# Patient Record
Sex: Female | Born: 1985 | Race: Black or African American | Hispanic: No | Marital: Single | Smoking: Never smoker
Health system: Southern US, Community
[De-identification: ages and names within clinical notes are randomized; demographics above are authoritative.]

## PROBLEM LIST (undated history)

## (undated) DIAGNOSIS — B009 Herpesviral infection, unspecified: Secondary | ICD-10-CM

## (undated) DIAGNOSIS — L409 Psoriasis, unspecified: Secondary | ICD-10-CM

## (undated) DIAGNOSIS — R011 Cardiac murmur, unspecified: Secondary | ICD-10-CM

## (undated) DIAGNOSIS — E349 Endocrine disorder, unspecified: Secondary | ICD-10-CM

## (undated) DIAGNOSIS — A64 Unspecified sexually transmitted disease: Secondary | ICD-10-CM

## (undated) DIAGNOSIS — J302 Other seasonal allergic rhinitis: Secondary | ICD-10-CM

## (undated) DIAGNOSIS — N946 Dysmenorrhea, unspecified: Secondary | ICD-10-CM

## (undated) DIAGNOSIS — L309 Dermatitis, unspecified: Secondary | ICD-10-CM

## (undated) DIAGNOSIS — D219 Benign neoplasm of connective and other soft tissue, unspecified: Secondary | ICD-10-CM

## (undated) DIAGNOSIS — E611 Iron deficiency: Secondary | ICD-10-CM

## (undated) DIAGNOSIS — R7309 Other abnormal glucose: Secondary | ICD-10-CM

## (undated) HISTORY — DX: Cardiac murmur, unspecified: R01.1

## (undated) HISTORY — DX: Endocrine disorder, unspecified: E34.9

## (undated) HISTORY — DX: Other abnormal glucose: R73.09

## (undated) HISTORY — DX: Unspecified sexually transmitted disease: A64

## (undated) HISTORY — DX: Iron deficiency: E61.1

## (undated) HISTORY — DX: Dysmenorrhea, unspecified: N94.6

## (undated) HISTORY — PX: EYE SURGERY: SHX253

## (undated) HISTORY — DX: Herpesviral infection, unspecified: B00.9

## (undated) HISTORY — PX: OTHER SURGICAL HISTORY: SHX169

## (undated) HISTORY — DX: Psoriasis, unspecified: L40.9

## (undated) HISTORY — DX: Other seasonal allergic rhinitis: J30.2

## (undated) HISTORY — DX: Benign neoplasm of connective and other soft tissue, unspecified: D21.9

## (undated) HISTORY — DX: Dermatitis, unspecified: L30.9

---

## 2009-06-25 ENCOUNTER — Emergency Department (HOSPITAL_COMMUNITY): Admission: EM | Admit: 2009-06-25 | Discharge: 2009-06-26 | Payer: Self-pay | Admitting: Emergency Medicine

## 2010-07-06 ENCOUNTER — Emergency Department (HOSPITAL_COMMUNITY): Admission: EM | Admit: 2010-07-06 | Discharge: 2010-07-06 | Payer: Self-pay | Admitting: Emergency Medicine

## 2011-01-17 LAB — PREGNANCY, URINE: Preg Test, Ur: NEGATIVE

## 2011-02-09 LAB — GC/CHLAMYDIA PROBE AMP, GENITAL
Chlamydia, DNA Probe: NEGATIVE
GC Probe Amp, Genital: NEGATIVE

## 2011-02-09 LAB — URINALYSIS, ROUTINE W REFLEX MICROSCOPIC
Bilirubin Urine: NEGATIVE
Nitrite: NEGATIVE
Specific Gravity, Urine: 1.023 (ref 1.005–1.030)
pH: 6.5 (ref 5.0–8.0)

## 2011-02-09 LAB — RPR: RPR Ser Ql: NONREACTIVE

## 2012-10-09 ENCOUNTER — Other Ambulatory Visit: Payer: Self-pay | Admitting: Obstetrics & Gynecology

## 2012-10-09 DIAGNOSIS — D219 Benign neoplasm of connective and other soft tissue, unspecified: Secondary | ICD-10-CM

## 2012-10-09 DIAGNOSIS — R1900 Intra-abdominal and pelvic swelling, mass and lump, unspecified site: Secondary | ICD-10-CM

## 2012-10-14 ENCOUNTER — Ambulatory Visit (HOSPITAL_COMMUNITY)
Admission: RE | Admit: 2012-10-14 | Discharge: 2012-10-14 | Disposition: A | Discharge status: Home / Self Care | Payer: BC Managed Care – PPO | Source: Ambulatory Visit | Attending: Obstetrics & Gynecology | Admitting: Obstetrics & Gynecology

## 2012-10-14 DIAGNOSIS — R1903 Right lower quadrant abdominal swelling, mass and lump: Secondary | ICD-10-CM | POA: Insufficient documentation

## 2012-10-14 DIAGNOSIS — R1900 Intra-abdominal and pelvic swelling, mass and lump, unspecified site: Secondary | ICD-10-CM

## 2012-10-14 DIAGNOSIS — D252 Subserosal leiomyoma of uterus: Secondary | ICD-10-CM | POA: Insufficient documentation

## 2012-10-14 DIAGNOSIS — D219 Benign neoplasm of connective and other soft tissue, unspecified: Secondary | ICD-10-CM

## 2013-07-23 ENCOUNTER — Encounter: Payer: Self-pay | Admitting: Obstetrics and Gynecology

## 2013-07-23 ENCOUNTER — Ambulatory Visit (INDEPENDENT_AMBULATORY_CARE_PROVIDER_SITE_OTHER): Payer: BC Managed Care – PPO | Admitting: Obstetrics and Gynecology

## 2013-07-23 VITALS — BP 120/80 | HR 76 | Ht 62.5 in | Wt 182.0 lb

## 2013-07-23 DIAGNOSIS — A499 Bacterial infection, unspecified: Secondary | ICD-10-CM

## 2013-07-23 DIAGNOSIS — Z113 Encounter for screening for infections with a predominantly sexual mode of transmission: Secondary | ICD-10-CM

## 2013-07-23 DIAGNOSIS — D219 Benign neoplasm of connective and other soft tissue, unspecified: Secondary | ICD-10-CM

## 2013-07-23 DIAGNOSIS — B9689 Other specified bacterial agents as the cause of diseases classified elsewhere: Secondary | ICD-10-CM

## 2013-07-23 DIAGNOSIS — N760 Acute vaginitis: Secondary | ICD-10-CM

## 2013-07-23 DIAGNOSIS — D259 Leiomyoma of uterus, unspecified: Secondary | ICD-10-CM

## 2013-07-23 MED ORDER — METRONIDAZOLE 0.75 % VA GEL: 1.0000 | Freq: Every day | VAGINAL | Status: DC

## 2013-07-23 NOTE — Patient Instructions (Signed)
Bacterial Vaginosis Bacterial vaginosis (BV) is a vaginal infection where the normal balance of bacteria in the vagina is disrupted. The normal balance is then replaced by an overgrowth of certain bacteria. There are several different kinds of bacteria that can cause BV. BV is the most common vaginal infection in women of childbearing age. CAUSES   The cause of BV is not fully understood. BV develops when there is an increase or imbalance of harmful bacteria.  Some activities or behaviors can upset the normal balance of bacteria in the vagina and put women at increased risk including:  Having a new sex partner or multiple sex partners.  Douching.  Using an intrauterine device (IUD) for contraception.  It is not clear what role sexual activity plays in the development of BV. However, women that have never had sexual intercourse are rarely infected with BV. Women do not get BV from toilet seats, bedding, swimming pools or from touching objects around them.  SYMPTOMS   Grey vaginal discharge.  A fish-like odor with discharge, especially after sexual intercourse.  Itching or burning of the vagina and vulva.  Burning or pain with urination.  Some women have no signs or symptoms at all. DIAGNOSIS  Your caregiver must examine the vagina for signs of BV. Your caregiver will perform lab tests and look at the sample of vaginal fluid through a microscope. They will look for bacteria and abnormal cells (clue cells), a pH test higher than 4.5, and a positive amine test all associated with BV.  RISKS AND COMPLICATIONS   Pelvic inflammatory disease (PID).  Infections following gynecology surgery.  Developing HIV.  Developing herpes virus. TREATMENT  Sometimes BV will clear up without treatment. However, all women with symptoms of BV should be treated to avoid complications, especially if gynecology surgery is planned. Female partners generally do not need to be treated. However, BV may spread  between female sex partners so treatment is helpful in preventing a recurrence of BV.   BV may be treated with antibiotics. The antibiotics come in either pill or vaginal cream forms. Either can be used with nonpregnant or pregnant women, but the recommended dosages differ. These antibiotics are not harmful to the baby.  BV can recur after treatment. If this happens, a second round of antibiotics will often be prescribed.  Treatment is important for pregnant women. If not treated, BV can cause a premature delivery, especially for a pregnant woman who had a premature birth in the past. All pregnant women who have symptoms of BV should be checked and treated.  For chronic reoccurrence of BV, treatment with a type of prescribed gel vaginally twice a week is helpful. HOME CARE INSTRUCTIONS   Finish all medication as directed by your caregiver.  Do not have sex until treatment is completed.  Tell your sexual partner that you have a vaginal infection. They should see their caregiver and be treated if they have problems, such as a mild rash or itching.  Practice safe sex. Use condoms. Only have 1 sex partner. PREVENTION  Basic prevention steps can help reduce the risk of upsetting the natural balance of bacteria in the vagina and developing BV:  Do not have sexual intercourse (be abstinent).  Do not douche.  Use all of the medicine prescribed for treatment of BV, even if the signs and symptoms go away.  Tell your sex partner if you have BV. That way, they can be treated, if needed, to prevent reoccurrence. SEEK MEDICAL CARE IF:     Your symptoms are not improving after 3 days of treatment.  You have increased discharge, pain, or fever. MAKE SURE YOU:   Understand these instructions.  Will watch your condition.  Will get help right away if you are not doing well or get worse. FOR MORE INFORMATION  Division of STD Prevention (DSTDP), Centers for Disease Control and Prevention:  SolutionApps.co.za American Social Health Association (ASHA): www.ashastd.org  Document Released: 10/21/2005 Document Revised: 01/13/2012 Document Reviewed: 04/13/2009 [facility] Patient Information 2014 [place], [place].  Fibroids Fibroids are lumps (tumors) that can occur any place in a woman's body. These lumps are not cancerous. Fibroids vary in size, weight, and where they grow. HOME CARE  Do not take aspirin.  Write down the number of pads or tampons you use during your period. Tell your doctor. This can help determine the best treatment for you. GET HELP RIGHT AWAY IF:  You have pain in your lower belly (abdomen) that is not helped with medicine.  You have cramps that are not helped with medicine.  You have more bleeding between or during your period.  You feel lightheaded or pass out (faint).  Your lower belly pain gets worse. MAKE SURE YOU:  Understand these instructions.  Will watch your condition.  Will get help right away if you are not doing well or get worse. Document Released: 11/23/2010 Document Revised: 01/13/2012 Document Reviewed: 11/23/2010 [facility] Patient Information 2014 [place], [place].

## 2013-07-23 NOTE — Progress Notes (Signed)
Patient ID: Allison Bentley, female   DOB: January 03, 1986, 27 y.o.   MRN: [id number] GYNECOLOGY PROBLEM VISIT  PCP:   [facility]  Referring provider:   HPI: 27 y.o.   Single  African American  female   G0P0 with Patient's last menstrual period was 07/11/2013.   here for  Second Opinion on Fibroids and also having vaginal discharge.  Discharge is brownish yellow off and on for a month. Some odor. No burning or itching.  No treatment.  Last intercourse was 2 months ago.  No protection. Accepts STD testing today.  Seen at [facility].   Does not want surgery. Noted on exam one year ago. Does not know she has fibroids but menses are a little more crampy. Menses heavy at the beginning.  Pad change every 2 - 3 hours. Can stain sheets at night. No intermenstrual bleeding. Some urinary urgency and frequency.   Empties bladder every 1 1/2 hours.   Sometimes drinks tea.   Pelvic ultrasound 10/09/12 -  RADIOLOGY REPORT*  Clinical Data: Right lower abdominal mass. Fibroids.  TRANSABDOMINAL AND TRANSVAGINAL ULTRASOUND OF PELVIS  Technique: Both transabdominal and transvaginal ultrasound  examinations of the pelvis were performed. Transabdominal  technique was performed for global imaging of the pelvis including  uterus, ovaries, adnexal regions, and pelvic cul-de-sac.  It was necessary to proceed with endovaginal exam following the  transabdominal exam to visualize the endometrium and ovaries.  Comparison: None.  Findings:  Uterus: 8.5 x 4.0 x 5.7 cm. A large subserosal fibroid is seen  arising from the right uterine fundus which measures 5.8 x 4.8 x  4.8 cm. A second smaller subserosal fibroid is seen arising from  the left posterior corpus which measures 3.0 x 2.1 x 2.8 cm.  Endometrium: Double layer thickness measures 5 mm transvaginally.  No focal lesion visualized.  Right ovary: 3.9 x 2.1 x 2.4 cm. Normal appearance.  Left ovary: 4.2 x 2.4 x 3.8 cm. Normal appearance.   Other Findings: No free fluid  IMPRESSION:  1. Uterine fibroids, largest of which is subserosal in the right  uterine fundus measuring 5.8 cm.  2. Normal appearanceof both ovaries.  Original Report Authenticated By: Myles Rosenthal, M.D.   GYNECOLOGIC HISTORY: Patient's last menstrual period was 07/11/2013. Sexually active:  Yes.  Not currently active.   Partner preference: female Contraception:  none  Menopausal hormone therapy: no DES exposure: no   Blood transfusions:  no  Sexually transmitted diseases: Hx of Gonorrhea and Chlamydia age 27   GYN Procedures:  no Mammogram:  never                 Pap:  10/2012 wnl - [facility].   History of abnormal pap smear:  Had abnormal pap in 2009 with no colposcopy or treatment to cervix.  Pap smear reverted to normal.   HGB:  11.9 OB History   Grav Para Term Preterm Abortions TAB SAB Ect Mult Living   0                  Family History  Problem Relation Age of Onset  . Hypertension Mother   . Thyroid disease Mother   . Diabetes Father   . Hypertension Father   . Diabetes Maternal Grandmother   . Hypertension Maternal Grandmother   . Stroke Maternal Grandmother   . Diabetes Maternal Grandfather   . Hypertension Maternal Grandfather   . Hypertension Paternal Grandmother   . Hypertension Paternal Grandfather  There are no active problems to display for this patient.   Past Medical History  Diagnosis Date  . Psoriasis of scalp   . Eczema     arms and neck  . Dysmenorrhea   . Fibroid   . Heart murmur     dx'd as a teenager and told would outgrow  . Hormone disorder     enlarged thyroid  . STD (sexually transmitted disease)     Tx'd for Chlamydia and Gonorrhea age 27  . Seasonal allergies     Past Surgical History  Procedure Laterality Date  . Eye surgery      to remove styes at age 27  . Wisdom teeth Bilateral     ALLERGIES: Review of patient's allergies indicates no known allergies.  Current Outpatient Prescriptions   Medication Sig Dispense Refill  . FLUOCINOLONE ACETONIDE SCALP 0.01 % external oil Apply 1 application topically every 14 (fourteen) days.      Marland Kitchen loratadine (CLARITIN) 10 MG tablet Take 10 mg by mouth as needed for allergies.       No current facility-administered medications for this visit.     ROS:  Pertinent items are noted in HPI.  SOCIAL HISTORY:    Runner, broadcasting/film/video.  Graduate school.    PHYSICAL EXAMINATION:    BP 120/80  Pulse 76  Ht 5' 2.5" (1.588 m)  Wt 182 lb (82.555 kg)  BMI 32.74 kg/m2  LMP 07/11/2013   Wt Readings from Last 3 Encounters:  07/23/13 182 lb (82.555 kg)     Ht Readings from Last 3 Encounters:  07/23/13 5' 2.5" (1.588 m)    General appearance: alert, cooperative and appears stated age Head: Normocephalic, without obvious abnormality, atraumatic Lungs: clear to auscultation bilaterally Heart: regular rate and rhythm Abdomen: soft, non-tender; no masses,  no organomegaly Extremities: extremities normal, atraumatic, no cyanosis or edema Skin: Skin color, texture, turgor normal. No rashes or lesions Lymph nodes: Cervical, supraclavicular, and axillary nodes normal. No abnormal inguinal nodes palpated Neurologic: Grossly normal  Pelvic: External genitalia:  no lesions              Urethra:  normal appearing urethra with no masses, tenderness or lesions              Bartholins and Skenes: normal                 Vagina: normal appearing vagina with normal color and discharge, no lesions              Cervix: normal appearance                       Bimanual Exam:  Uterus:  uterus with anterior LUS fibroid and 12 week size uterus.                                        Adnexa: normal adnexa in size, nontender and no masses                                      Rectovaginal exam - confirms above  Wet prep - pH 5.5, positive clue cells, negative yeast and trichomonas.   ASSESSMENT  Bacterial vaginosis. Uterine fibroids.   PLAN  Return for pelvic ultrasound  to understand if fibroids are stable  or not. Metrogel.  See EPIC orders.  Annual exam due in December.     An After Visit Summary was printed and given to the patient.

## 2013-07-24 LAB — STD PANEL
HIV: NONREACTIVE
Hepatitis B Surface Ag: NEGATIVE

## 2013-07-24 LAB — HEPATITIS C ANTIBODY: HCV Ab: NEGATIVE

## 2013-07-24 LAB — GC/CHLAMYDIA PROBE AMP, URINE: GC Probe Amp, Urine: NEGATIVE

## 2013-07-26 ENCOUNTER — Telehealth: Payer: Self-pay

## 2013-07-26 NOTE — Telephone Encounter (Signed)
Patient notified STD testing negative

## 2013-07-26 NOTE — Telephone Encounter (Signed)
LMOVM to call for lab results. 

## 2013-07-26 NOTE — Telephone Encounter (Signed)
Message copied by Alphonsa Overall on Mon Jul 26, 2013  2:45 PM ------      Message from: Conley Simmonds      Created: Sun Jul 25, 2013  8:52 AM       Please inform patient of negative STD testing. ------

## 2013-07-26 NOTE — Telephone Encounter (Signed)
Patient is returning call to [facility].

## 2013-07-27 ENCOUNTER — Telehealth: Payer: Self-pay | Admitting: Obstetrics and Gynecology

## 2013-07-27 NOTE — Telephone Encounter (Signed)
LMTCB to discuss ins benefits and schedule PUS. Advised that we do have openings on Thursday this week.

## 2013-08-05 ENCOUNTER — Ambulatory Visit (INDEPENDENT_AMBULATORY_CARE_PROVIDER_SITE_OTHER): Payer: BC Managed Care – PPO | Admitting: Obstetrics and Gynecology

## 2013-08-05 ENCOUNTER — Ambulatory Visit (INDEPENDENT_AMBULATORY_CARE_PROVIDER_SITE_OTHER): Payer: BC Managed Care – PPO

## 2013-08-05 ENCOUNTER — Other Ambulatory Visit: Payer: BC Managed Care – PPO | Admitting: Obstetrics and Gynecology

## 2013-08-05 ENCOUNTER — Other Ambulatory Visit: Payer: BC Managed Care – PPO

## 2013-08-05 ENCOUNTER — Encounter: Payer: Self-pay | Admitting: Obstetrics and Gynecology

## 2013-08-05 VITALS — BP 108/76 | HR 80 | Ht 62.5 in | Wt 185.5 lb

## 2013-08-05 DIAGNOSIS — D219 Benign neoplasm of connective and other soft tissue, unspecified: Secondary | ICD-10-CM

## 2013-08-05 DIAGNOSIS — D259 Leiomyoma of uterus, unspecified: Secondary | ICD-10-CM

## 2013-08-05 NOTE — Patient Instructions (Addendum)
Myomectomy Myoma is a non-cancerous tumor made up of fibrous tissue. It is also called leiomyoma, but more often called a fibroid tumor. Myomectomy is the removal of a fibroid tumor without removing another organ, like the uterus or ovary, with it. Fibroids range from the size of a pea to a grapefruit. They are rarely cancerous. Myomas only need treatment when they are growing or when they cause symptoms, such aspain, pressure, bleeding, and pain with intercourse. LET YOUR CAREGIVER KNOW ABOUT:  Any allergies, especially to medicines.  If you develop a cold or an infection before your surgery.  Medicines taken, including vitamins, herbs, eyedrops, over-ther-counter medicines, and creams.  Use of steroids (by mouth or creams).  Previous problems with numbing medicines.  History of blood clots or other bleeding problems.  Other health problems, such as diabetes, kidney, heart, or lung problems.  Previous surgery.  Possibility of pregnancy, if this applies. RISKS AND COMPLICATIONS   Excessive bleeding.  Infection.  Injury to other organs.  Blood clots in the legs, chest, and brain.  Scar tissue (adhesions) on other organs and in the pelvis.  Death during or after the surgery. BEFORE THE PROCEDURE  Follow your caregiver's advice regarding your surgery and preparing for surgery.  Avoid taking aspirin or blood thinners as directed by your caregiver.  DO NOT eat or drink anything after midnight on the night before surgery, or as directed by your caregiver.  DO NOT smoke (if you smoke) for 2 weeks before the surgery.  DO NOT drink alcohol the day before the surgery.  If you are admitted the day of the surgery,arrive1 hour before your surgery is scheduled.  Arrange to have someone take you home from the hospital.  Arrange to have someone care for you when you go home. PROCEDURE There are several ways to perform a myomectomy:  Hysteroscopy myomectomy. A lighted tube is  inserted inside the uterus. The tube will remove the fibroid. This is used when the fibroid is inside the cavity of the uterus.  Laparoscopic myomectomy. A long, lighted tube is inserted through 2 or 3 small incisions to see the organs in the pelvis. The fibroid is removed.  Myomectomy through a sugical cut (incicion) in the abdomen. The fibroid is removed through an incision made in the stomach. This way is performed when thethe fibroid cannot be removed with a hysteroscope or laprascope. AFTER THE PROCEDURE  If you had laparoscopic or hysteroscopic myomectomy, you may go home the same day or stay overnight.  If you had abdominal myomectomy, you may stay in the hospital a few days.  Your intravenous (IV)access tube and catheter will be removed in 1 or 2 days.  If you stay in the hospital, your caregiver will order pain medicine and a sleeping pill, if needed.  You may be placed on an antibiotic medicine, if needed.  You may be given written instructions and medicines before you are sent home. Document Released: 08/18/2007 Document Revised: 01/13/2012 Document Reviewed: 08/30/2009 [facility] Patient Information 2014 [place], [place].  Fibroids Fibroids are lumps (tumors) that can occur any place in a woman's body. These lumps are not cancerous. Fibroids vary in size, weight, and where they grow. HOME CARE  Do not take aspirin.  Write down the number of pads or tampons you use during your period. Tell your doctor. This can help determine the best treatment for you. GET HELP RIGHT AWAY IF:  You have pain in your lower belly (abdomen) that is not helped with  medicine.  You have cramps that are not helped with medicine.  You have more bleeding between or during your period.  You feel lightheaded or pass out (faint).  Your lower belly pain gets worse. MAKE SURE YOU:  Understand these instructions.  Will watch your condition.  Will get help right away if you are not doing well or  get worse. Document Released: 11/23/2010 Document Revised: 01/13/2012 Document Reviewed: 11/23/2010 [facility] Patient Information 2014 [place], [place].

## 2013-08-05 NOTE — Progress Notes (Signed)
Subjective  Patient is here for ultrasound for uterine fibroids.   Desires future childbearing.   School [employer], broadcasting/film/video.   Last ultrasound  Pelvic ultrasound 10/09/12 -  RADIOLOGY REPORT*  Clinical Data: Right lower abdominal mass. Fibroids.  TRANSABDOMINAL AND TRANSVAGINAL ULTRASOUND OF PELVIS  Technique: Both transabdominal and transvaginal ultrasound  examinations of the pelvis were performed. Transabdominal  technique was performed for global imaging of the pelvis including  uterus, ovaries, adnexal regions, and pelvic cul-de-sac.  It was necessary to proceed with endovaginal exam following the  transabdominal exam to visualize the endometrium and ovaries.  Comparison: None.  Findings:  Uterus: 8.5 x 4.0 x 5.7 cm. A large subserosal fibroid is seen  arising from the right uterine fundus which measures 5.8 x 4.8 x  4.8 cm. A second smaller subserosal fibroid is seen arising from  the left posterior corpus which measures 3.0 x 2.1 x 2.8 cm.  Endometrium: Double layer thickness measures 5 mm transvaginally.  No focal lesion visualized.  Right ovary: 3.9 x 2.1 x 2.4 cm. Normal appearance.  Left ovary: 4.2 x 2.4 x 3.8 cm. Normal appearance.  Other Findings: No free fluid  IMPRESSION:  1. Uterine fibroids, largest of which is subserosal in the right  uterine fundus measuring 5.8 cm.  2. Normal appearanceof both ovaries.  Original Report Authenticated By: Myles Rosenthal, M.D.   Objective  See ultrasound from today below - 3 fibroids noted.  Largest is 7.5 cm right and lateral.  Normal ovaries.     Assessment  Symptomatic uterine fibroids.  Plan  Discussion with patient regarding fibroids etiology, life history, and treatment options. We discussed medical options to control bleeding and surgical options to control bleeding and volume. Declines birth control and Depo Provera for treatment. We discussed abdominal myomectomy at length.  We discussed risks and benefits. We discussed  risks which include but are not limited to bleeding, infection, transfusion, hysterectomy, post op fever, development of scar tissue, need for future cesarean section, uterine rupture, recurrence of fibroids, incomplete procedure if fibroids look like removal would lead to hysterectomy, need for reoperation, reaction to anesthesia, DVT, PE, and death.  She will consider options and return prn.

## 2013-08-06 ENCOUNTER — Encounter: Payer: Self-pay | Admitting: Obstetrics and Gynecology

## 2013-08-06 DIAGNOSIS — D219 Benign neoplasm of connective and other soft tissue, unspecified: Secondary | ICD-10-CM | POA: Insufficient documentation

## 2013-09-23 ENCOUNTER — Telehealth: Payer: Self-pay | Admitting: Obstetrics and Gynecology

## 2013-09-23 NOTE — Telephone Encounter (Signed)
Pt is having some vaginal discharge again. Please call to schedule an appointment.

## 2013-09-23 NOTE — Telephone Encounter (Signed)
Spoke with pt who is having itching. Has tried Monistat with no relief. Pt seen for BV in September, but says the symptoms are not the same. Scheduled OV tomorrow with PG at 10:15.

## 2013-09-24 ENCOUNTER — Encounter: Payer: Self-pay | Admitting: Nurse Practitioner

## 2013-09-24 ENCOUNTER — Ambulatory Visit (INDEPENDENT_AMBULATORY_CARE_PROVIDER_SITE_OTHER): Payer: BC Managed Care – PPO | Admitting: Nurse Practitioner

## 2013-09-24 VITALS — BP 108/62 | HR 92 | Ht 62.5 in | Wt 192.0 lb

## 2013-09-24 DIAGNOSIS — N760 Acute vaginitis: Secondary | ICD-10-CM

## 2013-09-24 DIAGNOSIS — A499 Bacterial infection, unspecified: Secondary | ICD-10-CM

## 2013-09-24 DIAGNOSIS — B9689 Other specified bacterial agents as the cause of diseases classified elsewhere: Secondary | ICD-10-CM

## 2013-09-24 MED ORDER — METRONIDAZOLE 0.75 % VA GEL: 1.0000 | Freq: Every day | VAGINAL | Status: DC

## 2013-09-24 NOTE — Progress Notes (Signed)
Subjective:     Patient ID: Allison Bentley, female   DOB: [birth year], 27 y.o.   MRN: [id number]  HPI This 27 yo SAA Fe here with symptoms of vaginal discharge about a week ago.  She had symptoms of BV in September.  She thinks these symptoms are back and seem to be related to menses. Denies urinary symptoms. Her partner lives away from here and they are rarely SA.  She believes him to be faithful.  She also has been evaluated for uterine fibroids.  She believes she wants to pursue a myomectomy in the summer when school is out.  Review of Systems  Constitutional: Negative for fever, chills and fatigue.  HENT: Negative.   Respiratory: Negative.   Cardiovascular: Negative.   Gastrointestinal: Negative.   Genitourinary: Positive for vaginal discharge. Negative for dysuria, urgency, frequency, flank pain, enuresis and pelvic pain.  Musculoskeletal: Negative.   Skin: Negative.   Neurological: Negative.   Psychiatric/Behavioral: Negative.        Objective:   Physical Exam  Constitutional: She appears well-developed and well-nourished. No distress.  Abdominal: Soft. She exhibits no distension. There is no tenderness. There is no rebound and no guarding.  Genitourinary:  White to yelow tinged vaginal discharge. Wet prep: PH: 5.0; NSS + clue cells; KOH: negative yeast.       Assessment:     BV recurrent after menses History of uterine fibroids    Plan:     Discussed normal vaginal health care and triggers for recurrent infections She will also try Pro B daily Metrogel vaginal cream hs X 5 Her AEX is due in December and wants to discuss further about treatment of uterine fibroids.

## 2013-09-24 NOTE — Patient Instructions (Addendum)
Get Pro B at pharmacy and take daily.   Bacterial Vaginosis Bacterial vaginosis (BV) is a vaginal infection where the normal balance of bacteria in the vagina is disrupted. The normal balance is then replaced by an overgrowth of certain bacteria. There are several different kinds of bacteria that can cause BV. BV is the most common vaginal infection in women of childbearing age. CAUSES   The cause of BV is not fully understood. BV develops when there is an increase or imbalance of harmful bacteria.  Some activities or behaviors can upset the normal balance of bacteria in the vagina and put women at increased risk including:  Having a new sex partner or multiple sex partners.  Douching.  Using an intrauterine device (IUD) for contraception.  It is not clear what role sexual activity plays in the development of BV. However, women that have never had sexual intercourse are rarely infected with BV. Women do not get BV from toilet seats, bedding, swimming pools or from touching objects around them.  SYMPTOMS   Grey vaginal discharge.  A fish-like odor with discharge, especially after sexual intercourse.  Itching or burning of the vagina and vulva.  Burning or pain with urination.  Some women have no signs or symptoms at all. DIAGNOSIS  Your caregiver must examine the vagina for signs of BV. Your caregiver will perform lab tests and look at the sample of vaginal fluid through a microscope. They will look for bacteria and abnormal cells (clue cells), a pH test higher than 4.5, and a positive amine test all associated with BV.  RISKS AND COMPLICATIONS   Pelvic inflammatory disease (PID).  Infections following gynecology surgery.  Developing HIV.  Developing herpes virus. TREATMENT  Sometimes BV will clear up without treatment. However, all women with symptoms of BV should be treated to avoid complications, especially if gynecology surgery is planned. Female partners generally do not  need to be treated. However, BV may spread between female sex partners so treatment is helpful in preventing a recurrence of BV.   BV may be treated with antibiotics. The antibiotics come in either pill or vaginal cream forms. Either can be used with nonpregnant or pregnant women, but the recommended dosages differ. These antibiotics are not harmful to the baby.  BV can recur after treatment. If this happens, a second round of antibiotics will often be prescribed.  Treatment is important for pregnant women. If not treated, BV can cause a premature delivery, especially for a pregnant woman who had a premature birth in the past. All pregnant women who have symptoms of BV should be checked and treated.  For chronic reoccurrence of BV, treatment with a type of prescribed gel vaginally twice a week is helpful. HOME CARE INSTRUCTIONS   Finish all medication as directed by your caregiver.  Do not have sex until treatment is completed.  Tell your sexual partner that you have a vaginal infection. They should see their caregiver and be treated if they have problems, such as a mild rash or itching.  Practice safe sex. Use condoms. Only have 1 sex partner. PREVENTION  Basic prevention steps can help reduce the risk of upsetting the natural balance of bacteria in the vagina and developing BV:  Do not have sexual intercourse (be abstinent).  Do not douche.  Use all of the medicine prescribed for treatment of BV, even if the signs and symptoms go away.  Tell your sex partner if you have BV. That way, they can be treated,  if needed, to prevent reoccurrence. SEEK MEDICAL CARE IF:   Your symptoms are not improving after 3 days of treatment.  You have increased discharge, pain, or fever. MAKE SURE YOU:   Understand these instructions.  Will watch your condition.  Will get help right away if you are not doing well or get worse. FOR MORE INFORMATION  Division of STD Prevention (DSTDP), Centers  for Disease Control and Prevention: [employer] American Social Health Association (ASHA): www.ashastd.org  Document Released: 10/21/2005 Document Revised: 01/13/2012 Document Reviewed: 06/02/2013 [facility] Patient Information 2014 [place], [place].

## 2013-09-27 NOTE — Progress Notes (Signed)
Encounter reviewed by Dr. Brook Silva.  

## 2014-03-04 DIAGNOSIS — L219 Seborrheic dermatitis, unspecified: Secondary | ICD-10-CM | POA: Insufficient documentation

## 2014-03-09 ENCOUNTER — Encounter (HOSPITAL_COMMUNITY): Payer: Self-pay | Admitting: Emergency Medicine

## 2014-03-09 ENCOUNTER — Emergency Department (HOSPITAL_COMMUNITY)
Admission: EM | Admit: 2014-03-09 | Discharge: 2014-03-09 | Disposition: A | Discharge status: Home / Self Care | Payer: BC Managed Care – PPO | Attending: Emergency Medicine | Admitting: Emergency Medicine

## 2014-03-09 DIAGNOSIS — Z8679 Personal history of other diseases of the circulatory system: Secondary | ICD-10-CM | POA: Insufficient documentation

## 2014-03-09 DIAGNOSIS — R011 Cardiac murmur, unspecified: Secondary | ICD-10-CM | POA: Insufficient documentation

## 2014-03-09 DIAGNOSIS — Z872 Personal history of diseases of the skin and subcutaneous tissue: Secondary | ICD-10-CM | POA: Insufficient documentation

## 2014-03-09 DIAGNOSIS — M25569 Pain in unspecified knee: Secondary | ICD-10-CM

## 2014-03-09 DIAGNOSIS — Z8619 Personal history of other infectious and parasitic diseases: Secondary | ICD-10-CM | POA: Insufficient documentation

## 2014-03-09 DIAGNOSIS — Z8742 Personal history of other diseases of the female genital tract: Secondary | ICD-10-CM | POA: Insufficient documentation

## 2014-03-09 MED ORDER — IBUPROFEN 800 MG PO TABS: 800.0000 mg | ORAL_TABLET | Freq: Three times a day (TID) | ORAL | Status: DC

## 2014-03-09 NOTE — Discharge Instructions (Signed)
Meniscus Tear with Phase I Rehab The meniscus is a C-shaped cartilage structure, located in the knee joint between the thigh bone (femur) and the shinbone (tibia). Two menisci are located in each knee joint: the inner and outer meniscus. The meniscus acts as an adapter between the thigh bone and shinbone, allowing them to fit properly together. It also functions as a shock absorber, to reduce the stress placed on the knee joint and to help supply nutrients to the knee joint cartilage. As people age, the meniscus begins to harden and become more vulnerable to injury. Meniscus tears are a common injury, especially in older athletes. Inner meniscus tears are more common than outer meniscus tears.  SYMPTOMS   Pain in the knee, especially with standing or squatting with the affected leg.  Tenderness along the joint line.  Swelling in the knee joint (effusion), usually starting 1 to 2 days after injury.  Locking or catching of the knee joint, causing inability to straighten the knee completely.  Giving way or buckling of the knee. CAUSES  A meniscus tear occurs when a force is placed on the meniscus that is greater than it can handle. Common causes of injury include:  Direct hit (trauma) to the knee.  Twisting, pivoting, or cutting (rapidly changing direction while running), kneeling or squatting.  Without injury, due to aging. RISK INCREASES WITH:  Contact sports (football, rugby).  Sports in which cleats are used with pivoting (soccer, lacrosse) or sports in which good shoe grip and sudden change in direction are required (racquetball, basketball, squash).  Previous knee injury.  Associated knee injury, particularly ligament injuries.  Poor strength and flexibility. PREVENTION  Warm up and stretch properly before activity.  Maintain physical fitness:  Strength, flexibility, and endurance.  Cardiovascular fitness.  Protect the knee with a brace or elastic bandage.  Wear  properly fitted protective equipment (proper cleats for the surface). PROGNOSIS  Sometimes, meniscus tears heal on their own. However, definitive treatment requires surgery, followed by at least 6 weeks of recovery.  RELATED COMPLICATIONS   Recurring symptoms that result in a chronic problem.  Repeated knee injury, especially if sports are resumed too soon after injury or surgery.  Progression of the tear (the tear gets larger), if untreated.  Arthritis of the knee in later years (with or without surgery).  Complications of surgery, including infection, bleeding, injury to nerves (numbness, weakness, paralysis) continued pain, giving way, locking, nonhealing of meniscus (if repaired), need for further surgery, and knee stiffness (loss of motion). TREATMENT  Treatment first involves the use of ice and medicine, to reduce pain and inflammation. You may find using crutches to walk more comfortable. However, it is okay to bear weight on the injured knee, if the pain will allow it. Surgery is often advised as a definitive treatment. Surgery is performed through an incision near the joint (arthroscopically). The torn piece of the meniscus is removed, and if possible the joint cartilage is repaired. After surgery, the joint must be restrained. After restraint, it is important to perform strengthening and stretching exercises to help regain strength and a full range of motion. These exercises may be completed at home or with a therapist.  MEDICATION  If pain medicine is needed, nonsteroidal anti-inflammatory medicines (aspirin and ibuprofen), or other minor pain relievers (acetaminophen), are often advised.  Do not take pain medicine for 7 days before surgery.  Prescription pain relievers may be given, if your caregiver thinks they are needed. Use only as directed and   only as much as you need. HEAT AND COLD  Cold treatment (icing) should be applied for 10 to 15 minutes every 2 to 3 hours for  inflammation and pain, and immediately after activity that aggravates your symptoms. Use ice packs or an ice massage.  Heat treatment may be used before performing stretching and strengthening activities prescribed by your caregiver, physical therapist, or athletic trainer. Use a heat pack or a warm water soak. SEEK MEDICAL CARE IF:   Symptoms get worse or do not improve in 2 weeks, despite treatment.  New, unexplained symptoms develop. (Drugs used in treatment may produce side effects.) EXERCISES RANGE OF MOTION (ROM) AND STRETCHING EXERCISES - Meniscus Tear, Non-operative, Phase I These are some of the initial exercises with which you may start your rehabilitation program, until you see your caregiver again or until your symptoms are resolved. Remember:   These initial exercises are intended to be gentle. They will help you restore motion without increasing any swelling.  Completing these exercises allows less painful movement and prepares you for the more aggressive strengthening exercises in Phase II.  An effective stretch should be held for at least 30 seconds.  A stretch should never be painful. You should only feel a gentle lengthening or release in the stretched tissue. RANGE OF MOTION - Knee Flexion, Active  Lie on your back with both knees straight. (If this causes back discomfort, bend your healthy knee, placing your foot flat on the floor.)  Slowly slide your heel back toward your buttocks until you feel a gentle stretch in the front of your knee or thigh.  Hold for __________ seconds. Slowly slide your heel back to the starting position. Repeat __________ times. Complete this exercise __________ times per day.  RANGE OF MOTION - Knee Flexion and Extension, Active-Assisted  Sit on the edge of a table or chair with your thighs firmly supported. It may be helpful to place a folded towel under the end of your right / left thigh.  Flexion (bending): Place the ankle of your  healthy leg on top of the other ankle. Use your healthy leg to gently bend your right / left knee until you feel a mild tension across the top of your knee.  Hold for __________ seconds.  Extension (straightening): Switch your ankles so your right / left leg is on top. Use your healthy leg to straighten your right / left knee until you feel a mild tension on the backside of your knee.  Hold for __________ seconds. Repeat __________ times. Complete __________ times per day. STRETCH - Knee Flexion, Supine  Lie on the floor with your right / left heel and foot lightly touching the wall. (Place both feet on the wall if you do not use a door frame.)  Without using any effort, allow gravity to slide your foot down the wall slowly until you feel a gentle stretch in the front of your right / left knee.  Hold this stretch for __________ seconds. Then return the leg to the starting position, using your healthy leg for help, if needed. Repeat __________ times. Complete this stretch __________ times per day.  STRETCH - Knee Extension Sitting  Sit with your right / left leg/heel propped on another chair, coffee table, or foot stool.  Allow your leg muscles to relax, letting gravity straighten out your knee.*  You should feel a stretch behind your right / left knee. Hold this position for __________ seconds. Repeat __________ times. Complete this stretch __________   times per day.  *Your physician, physical therapist or athletic trainer may instruct you place a __________ weight on your thigh, just above your kneecap, to deepen the stretch.  STRENGTHENING EXERCISES - Meniscus Tear, Non-operative, Phase I These exercises may help you when beginning to rehabilitate your injury. They may resolve your symptoms with or without further involvement from your physician, physical therapist or athletic trainer. While completing these exercises, remember:   Muscles can gain both the endurance and the strength  needed for everyday activities through controlled exercises.  Complete these exercises as instructed by your physician, physical therapist or athletic trainer. Progress the resistance and repetitions only as guided. STRENGTH - Quadriceps, Isometrics  Lie on your back with your right / left leg extended and your opposite knee bent.  Gradually tense the muscles in the front of your right / left thigh. You should see either your knee cap slide up toward your hip or increased dimpling just above the knee. This motion will push the back of the knee down toward the floor, mat, or bed on which you are lying.  Hold the muscle as tight as you can, without increasing your pain, for __________ seconds.  Relax the muscles slowly and completely between each repetition. Repeat __________ times. Complete this exercise __________ times per day.  STRENGTH - Quadriceps, Short Arcs   Lie on your back. Place a __________ inch towel roll under your right / left knee, so that the knee bends slightly.  Raise only your lower leg by tightening the muscles in the front of your thigh. Do not allow your thigh to rise.  Hold this position for __________ seconds. Repeat __________ times. Complete this exercise __________ times per day.  OPTIONAL ANKLE WEIGHTS: Begin with ____________________, but DO NOT exceed ____________________. Increase in 1 pound/0.5 kilogram increments. STRENGTH - Quadriceps, Straight Leg Raises  Quality counts! Watch for signs that the quadriceps muscle is working, to be sure you are strengthening the correct muscles and not "cheating" by substituting with healthier muscles.  Lay on your back with your right / left leg extended and your opposite knee bent.  Tense the muscles in the front of your right / left thigh. You should see either your knee cap slide up or increased dimpling just above the knee. Your thigh may even shake a bit.  Tighten these muscles even more and raise your leg 4 to 6  inches off the floor. Hold for __________ seconds.  Keeping these muscles tense, lower your leg.  Relax the muscles slowly and completely in between each repetition. Repeat __________ times. Complete this exercise __________ times per day.  STRENGTH - Hamstring, Curls   Lay on your stomach with your legs extended. (If you lay on a bed, your feet may hang over the edge.)  Tighten the muscles in the back of your thigh to bend your right / left knee up to 90 degrees. Keep your hips flat on the bed.  Hold this position for __________ seconds.  Slowly lower your leg back to the starting position. Repeat __________ times. Complete this exercise __________ times per day.  STRENGTH  Quadriceps, Squats  Stand in a door frame so that your feet and knees are in line with the frame.  Use your hands for balance, not support, on the frame.  Slowly lower your weight, bending at the hips and knees. Keep your lower legs upright so that they are parallel with the door frame. Squat only within the range that does   not increase your knee pain. Never let your hips drop below your knees.  Slowly return upright, pushing with your legs, not pulling with your hands. Repeat __________ times. Complete this exercise __________ times per day.  STRENGTH - Quad/VMO, Isometric   Sit in a chair with your right / left knee slightly bent. With your fingertips, feel the VMO muscle just above the inside of your knee. The VMO is important in controlling the position of your kneecap.  Keeping your fingertips on this muscle. Without actually moving your leg, attempt to drive your knee down as if straightening your leg. You should feel your VMO tense. If you have a difficult time, you may wish to try the same exercise on your healthy knee first.  Tense this muscle as hard as you can without increasing any knee pain.  Hold for __________ seconds. Relax the muscles slowly and completely in between each repetition. Repeat  __________ times. Complete exercise __________ times per day.  Document Released: 11/04/1998 Document Revised: 01/13/2012 Document Reviewed: 02/02/2009 [employer] Patient Information 2014 [employer].    

## 2014-03-09 NOTE — ED Provider Notes (Signed)
Medical screening examination/treatment/procedure(s) were performed by non-physician practitioner and as supervising physician I was immediately available for consultation/collaboration.   EKG Interpretation None        Braden Deloach T Remona Boom, MD 03/09/14 1643 

## 2014-03-09 NOTE — ED Provider Notes (Signed)
CSN: [id number]     Arrival date & time 03/09/14  1006 History   First MD Initiated Contact with Patient 03/09/14 1012     Chief Complaint  Patient presents with  . Knee Pain     (Consider location/radiation/quality/duration/timing/severity/associated sxs/prior Treatment) HPI Comments: Patient presents to the ED with a chief complaint of left knee pain.  She states that the pain started about a month ago, but has worsened over the past week.  She denies any known MOI.  She states that there is associated clicking and popping of the knee.  It is worse when bending and walking.  She has not taken anything to alleviate her symptoms.  She denies any fevers, chills, nausea, or vomiting.  The history is provided by the patient. No language interpreter was used.    Past Medical History  Diagnosis Date  . Psoriasis of scalp   . Eczema     arms and neck  . Dysmenorrhea   . Fibroid   . Heart murmur     dx'd as a teenager and told would outgrow  . Hormone disorder     enlarged thyroid  . STD (sexually transmitted disease)     Tx'd for Chlamydia and Gonorrhea age 28  . Seasonal allergies    Past Surgical History  Procedure Laterality Date  . Eye surgery      to remove styes at age 22  . Wisdom teeth Bilateral    Family History  Problem Relation Age of Onset  . Hypertension Mother   . Thyroid disease Mother   . Diabetes Father   . Hypertension Father   . Diabetes Maternal Grandmother   . Hypertension Maternal Grandmother   . Stroke Maternal Grandmother   . Diabetes Maternal Grandfather   . Hypertension Maternal Grandfather   . Hypertension Paternal Grandmother   . Hypertension Paternal Grandfather    History  Substance Use Topics  . Smoking status: Never Smoker   . Smokeless tobacco: Never Used  . Alcohol Use: No   OB History   Grav Para Term Preterm Abortions TAB SAB Ect Mult Living   0              Review of Systems  Constitutional: Negative for fever and chills.   Respiratory: Negative for shortness of breath.   Cardiovascular: Negative for chest pain.  Gastrointestinal: Negative for nausea, vomiting, diarrhea and constipation.  Genitourinary: Negative for dysuria.  Musculoskeletal: Positive for arthralgias. Negative for joint swelling.  Skin: Negative for color change and wound.      Allergies  Review of patient's allergies indicates no known allergies.  Home Medications   Prior to Admission medications   Medication Sig Start Date End Date Taking? Authorizing Provider  FLUOCINOLONE ACETONIDE SCALP 0.01 % external oil Apply 1 application topically every 14 (fourteen) days. 07/14/13   Historical Provider, MD  loratadine (CLARITIN) 10 MG tablet Take 10 mg by mouth as needed for allergies.    Historical Provider, MD  metroNIDAZOLE (METROGEL) 0.75 % vaginal gel Place 1 Applicatorful vaginally at bedtime. 09/24/13   Mardene Celeste Rolen-Grubb, FNP   BP 127/79  Pulse 69  Temp(Src) 98.7 F (37.1 C) (Oral)  Resp 18  SpO2 100% Physical Exam  Nursing note and vitals reviewed. Constitutional: She is oriented to person, place, and time. She appears well-developed and well-nourished.  HENT:  Head: Normocephalic and atraumatic.  Eyes: Conjunctivae and EOM are normal.  Neck: Normal range of motion.  Cardiovascular: Normal rate and intact  distal pulses.   Pulmonary/Chest: Effort normal.  Abdominal: She exhibits no distension.  Musculoskeletal: Normal range of motion.  ROM and strength 5/5, no bony abnormality or deformity, negative joint stability testing, positive McMurray, no evidence of DVT or septic joint  Neurological: She is alert and oriented to person, place, and time.  Skin: Skin is dry.  Psychiatric: She has a normal mood and affect. Her behavior is normal. Judgment and thought content normal.    ED Course  Procedures (including critical care time) Labs Review Labs Reviewed - No data to display  Imaging Review No results found.   EKG  Interpretation None      MDM   Final diagnoses:  Knee pain    Patient with probable meniscal tear.  Positive McMurray.  Worse with bending.  Clears [place] knee rules.  No imaging.  Knee sleeve and orthopedic follow-up.   Montine Circle, PA-C 03/09/14 1058

## 2014-03-09 NOTE — ED Notes (Signed)
Pt states that she has been having lt knee pain x 1 wk.  Denies injury.  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

## 2014-04-25 ENCOUNTER — Telehealth: Payer: Self-pay | Admitting: Obstetrics and Gynecology

## 2014-04-25 ENCOUNTER — Encounter: Payer: Self-pay | Admitting: Obstetrics and Gynecology

## 2014-04-25 ENCOUNTER — Ambulatory Visit (INDEPENDENT_AMBULATORY_CARE_PROVIDER_SITE_OTHER): Payer: BC Managed Care – PPO | Admitting: Obstetrics and Gynecology

## 2014-04-25 ENCOUNTER — Telehealth: Payer: Self-pay

## 2014-04-25 VITALS — BP 118/70 | HR 70 | Resp 20 | Ht 62.5 in | Wt 190.4 lb

## 2014-04-25 DIAGNOSIS — B9689 Other specified bacterial agents as the cause of diseases classified elsewhere: Secondary | ICD-10-CM

## 2014-04-25 DIAGNOSIS — A499 Bacterial infection, unspecified: Secondary | ICD-10-CM

## 2014-04-25 DIAGNOSIS — Z113 Encounter for screening for infections with a predominantly sexual mode of transmission: Secondary | ICD-10-CM

## 2014-04-25 DIAGNOSIS — N949 Unspecified condition associated with female genital organs and menstrual cycle: Secondary | ICD-10-CM

## 2014-04-25 DIAGNOSIS — N760 Acute vaginitis: Secondary | ICD-10-CM

## 2014-04-25 DIAGNOSIS — M25559 Pain in unspecified hip: Secondary | ICD-10-CM

## 2014-04-25 LAB — POCT URINE PREGNANCY: Preg Test, Ur: NEGATIVE

## 2014-04-25 LAB — POCT URINALYSIS DIPSTICK
BILIRUBIN UA: NEGATIVE
Blood, UA: NEGATIVE
GLUCOSE UA: NEGATIVE
KETONES UA: NEGATIVE
LEUKOCYTES UA: NEGATIVE
Nitrite, UA: NEGATIVE
PROTEIN UA: NEGATIVE
Urobilinogen, UA: NEGATIVE
pH, UA: 5

## 2014-04-25 MED ORDER — METRONIDAZOLE 0.75 % VA GEL: 1.0000 | Freq: Every day | VAGINAL | Status: DC

## 2014-04-25 NOTE — Progress Notes (Signed)
Patient ID: Allison Bentley, female   DOB: January 28, 1986, 28 y.o.   MRN: [id number] GYNECOLOGY VISIT  PCP:  [facility]  Referring provider:   HPI: 28 y.o.   Single  African American  female   G0P0 with Patient's last menstrual period was 04/15/2014.  Normal cycle.  here for  Pelvic pain which began last PM. No N & V or fever with pain.  Had sudden onset of bilateral lower abdominal pain.  Pain came on at rest.  No pain like this before. Took Tylenol which is helping.  Better with movement.  Little constipation. BM this am made pain better.  No dysuria.  Known fibroids. Largest is 7.5 cm, right and lateral.  New-old partner.  Last sexually activity on 04/13/14. No contraception, using withdrawal.   Some vaginal discharge.   Some menstrual headaches.   UPT - negative. UA - negative  GYNECOLOGIC HISTORY: Patient's last menstrual period was 04/15/2014. Sexually active:  Not currently Partner preference: female Contraception:  abstinence  Menopausal hormone therapy: n/a DES exposure:  no  Blood transfusions:  no  Sexually transmitted diseases:  Treated for GC and CT age 28.  GYN procedures and prior surgeries: no  Last mammogram: no                Last pap and high risk HPV testing: 10/2012 wnl   History of abnormal pap smear:  2009 but no colposcopy or treatment to cervix.  Pap reverted to normal   OB History   Grav Para Term Preterm Abortions TAB SAB Ect Mult Living   0                LIFESTYLE: Exercise:               Tobacco:   no Alcohol:     no Drug use:  no  Patient Active Problem List   Diagnosis Date Noted  . Fibroids 08/06/2013    Past Medical History  Diagnosis Date  . Psoriasis of scalp   . Eczema     arms and neck  . Dysmenorrhea   . Fibroid   . Heart murmur     dx'd as a teenager and told would outgrow  . Hormone disorder     enlarged thyroid  . STD (sexually transmitted disease)     Tx'd for Chlamydia and Gonorrhea age 18  .  Seasonal allergies     Past Surgical History  Procedure Laterality Date  . Eye surgery      to remove styes at age 28  . Wisdom teeth Bilateral     Current Outpatient Prescriptions  Medication Sig Dispense Refill  . acetaminophen (TYLENOL) 500 MG tablet Take 500 mg by mouth every 6 (six) hours as needed.      Marland Kitchen FLUOCINOLONE ACETONIDE SCALP 0.01 % external oil Apply 1 application topically every 14 (fourteen) days.      Marland Kitchen ibuprofen (ADVIL,MOTRIN) 800 MG tablet Take 1 tablet (800 mg total) by mouth 3 (three) times daily.  21 tablet  0   No current facility-administered medications for this visit.     ALLERGIES: Review of patient's allergies indicates no known allergies.  Family History  Problem Relation Age of Onset  . Hypertension Mother   . Thyroid disease Mother   . Diabetes Father   . Hypertension Father   . Diabetes Maternal Grandmother   . Hypertension Maternal Grandmother   . Stroke Maternal Grandmother   . Diabetes Maternal Grandfather   .  Hypertension Maternal Grandfather   . Hypertension Paternal Grandmother   . Hypertension Paternal Grandfather     History   Social History  . Marital Status: Single    Spouse Name: N/A    Number of Children: N/A  . Years of Education: N/A   Occupational History  . Not on file.   Social History Main Topics  . Smoking status: Never Smoker   . Smokeless tobacco: Never Used  . Alcohol Use: No  . Drug Use: No  . Sexual Activity: No   Other Topics Concern  . Not on file   Social History Narrative  . No narrative on file    ROS:  Pertinent items are noted in HPI.  PHYSICAL EXAMINATION:    BP 118/70  Pulse 70  Resp 20  Ht 5' 2.5" (1.588 m)  Wt 190 lb 6.4 oz (86.365 kg)  BMI 34.25 kg/m2  LMP 04/15/2014   Wt Readings from Last 3 Encounters:  04/25/14 190 lb 6.4 oz (86.365 kg)  09/24/13 192 lb (87.091 kg)  08/05/13 185 lb 8 oz (84.142 kg)     Ht Readings from Last 3 Encounters:  04/25/14 5' 2.5" (1.588 m)   09/24/13 5' 2.5" (1.588 m)  08/05/13 5' 2.5" (1.588 m)    General appearance: alert, cooperative and appears stated age Head: Normocephalic, without obvious abnormality, atraumatic Lungs: clear to auscultation bilaterally Heart: regular rate and rhythm Abdomen: soft, non-tender; no masses,  no organomegaly Extremities: extremities normal, atraumatic, no cyanosis or edema Skin: Skin color, texture, turgor normal. No rashes or lesions Lymph nodes: Cervical, supraclavicular, and axillary nodes normal. No abnormal inguinal nodes palpated Neurologic: Grossly normal  Pelvic: External genitalia:  no lesions              Urethra:  normal appearing urethra with no masses, tenderness or lesions              Bartholins and Skenes: normal                 Vagina: normal appearing vagina with normal color and discharge, no lesions              Cervix: normal appearance                 Bimanual Exam:  Uterus:  uterus is 12 week size with LUS fibroid, consistency and nontender                                      Adnexa: normal adnexa in size, nontender and no masses                                   Wet prep - pH 5.0, positive clue cells.  Negative yeast and trichomonas.   ASSESSMENT  Pelvic pain.  I suspect a ruptured ovarian cyst. No acute abdomen.  UPT negative.  Uterine fibroids - stable.  Clinical picture does not fit for fibroid infarction.  Needs for STD screening.  Bacterial vaginosis.   PLAN  Ibuprofen 600 mg po q 6 hours prn pain.  Metrogel.  See Epic orders.  STD screening.  Declines birth control. Discussed prenatal vitamins.  Return for persistent pain or recurrent acute pain.   An After Visit Summary was printed and given to the patient.  25 minutes face to face  time of which over 50% was spent in counseling.

## 2014-04-25 NOTE — Telephone Encounter (Signed)
Spoke with patient. Patient states that she woke up last night with "The worst pelvic pain. I could not sleep it was so bad." Patient states last menses ended on 6/19. Pain is 9/10 but feels better with activity. Patient has taken 500MG  of tylenol which did provide some pain relief. "I was diagnosed with uterine fibroids but this pain is so bad. I do not know if it is from that." Patient has also been having vaginal discharge for around one month. Discharge is white/grey in color with no odor. Denies vaginal itching or irritation. Patient states that it tends to happen after cycle and she has used refresh in the past with success but now it has reoccurred. Advised patient will need to come in for office visit with Dr.Silva for evaluation. Patient is agreeable but states "I am unable to pay my copay today do you guys bill?" Advised would have to check for patient. Per Seth Bake it is okay for patient to be billed for office visit as she does not have any pending charges with our office at this time. Appointment scheduled for 1330 today with Dr.Silva. Patient agreeable to date and time.  Routing to provider for final review. Patient agreeable to disposition. Will close encounter

## 2014-04-25 NOTE — Telephone Encounter (Signed)
Pt is having pelvic pain. Please call to schedule

## 2014-04-25 NOTE — Telephone Encounter (Signed)
LMOVM for pt. To call.  Patient seen in office today for pelvic pain and Dr. Quincy Simmonds ordered GC/CT on her urine but I threw out her urine accidentally.  Patient will need to come back by To collect another specimen for the cultures.(Dr. Quincy Simmonds aware)  Advised pt to ask for me or the Triage nurse.

## 2014-04-26 LAB — STD PANEL
HEP B S AG: NEGATIVE
HIV: NONREACTIVE

## 2014-04-26 LAB — HEPATITIS C ANTIBODY: HCV Ab: NEGATIVE

## 2014-04-27 ENCOUNTER — Telehealth: Payer: Self-pay

## 2014-04-27 NOTE — Telephone Encounter (Signed)
Message copied by Lowella Fairy on Wed Apr 27, 2014  8:52 AM ------      Message from: [place], BROOK E      Created: Tue Apr 26, 2014  6:26 PM       Please report negative HIV, syphilis, Hep B and Hep C testing to patient.       GC/CT need to be done yet.      Please have the patient return to give a urine specimen for this. ------

## 2014-04-27 NOTE — Telephone Encounter (Signed)
Spoke with pt and notified of message.  She will return to office on 04-29-14 to recollect urine specimen for GC/CT.  Pt. Out of office til then.

## 2014-04-29 ENCOUNTER — Ambulatory Visit (INDEPENDENT_AMBULATORY_CARE_PROVIDER_SITE_OTHER): Payer: BC Managed Care – PPO | Admitting: *Deleted

## 2014-04-29 DIAGNOSIS — Z113 Encounter for screening for infections with a predominantly sexual mode of transmission: Secondary | ICD-10-CM

## 2014-04-29 NOTE — Progress Notes (Signed)
Patient ID: Allison Bentley, female   DOB: [birth year], 28 y.o.   MRN: [id number] Pt arrived.  Urine sample collected and sent for GC/Chlam.  Told pt thank you, again for coming back in.

## 2014-04-30 LAB — GC/CHLAMYDIA PROBE AMP, URINE
CHLAMYDIA, SWAB/URINE, PCR: NEGATIVE
GC PROBE AMP, URINE: NEGATIVE

## 2015-05-06 ENCOUNTER — Encounter (HOSPITAL_COMMUNITY): Payer: Self-pay

## 2015-05-06 ENCOUNTER — Emergency Department (HOSPITAL_COMMUNITY): Payer: BC Managed Care – PPO

## 2015-05-06 ENCOUNTER — Emergency Department (HOSPITAL_COMMUNITY)
Admission: EM | Admit: 2015-05-06 | Discharge: 2015-05-07 | Disposition: A | Discharge status: Home / Self Care | Payer: BC Managed Care – PPO | Attending: Emergency Medicine | Admitting: Emergency Medicine

## 2015-05-06 DIAGNOSIS — Z872 Personal history of diseases of the skin and subcutaneous tissue: Secondary | ICD-10-CM | POA: Diagnosis not present

## 2015-05-06 DIAGNOSIS — Z8742 Personal history of other diseases of the female genital tract: Secondary | ICD-10-CM | POA: Diagnosis not present

## 2015-05-06 DIAGNOSIS — D259 Leiomyoma of uterus, unspecified: Secondary | ICD-10-CM | POA: Diagnosis not present

## 2015-05-06 DIAGNOSIS — Z8639 Personal history of other endocrine, nutritional and metabolic disease: Secondary | ICD-10-CM | POA: Diagnosis not present

## 2015-05-06 DIAGNOSIS — Z3202 Encounter for pregnancy test, result negative: Secondary | ICD-10-CM | POA: Diagnosis not present

## 2015-05-06 DIAGNOSIS — Z8619 Personal history of other infectious and parasitic diseases: Secondary | ICD-10-CM | POA: Insufficient documentation

## 2015-05-06 DIAGNOSIS — R011 Cardiac murmur, unspecified: Secondary | ICD-10-CM | POA: Diagnosis not present

## 2015-05-06 DIAGNOSIS — R109 Unspecified abdominal pain: Secondary | ICD-10-CM | POA: Diagnosis present

## 2015-05-06 DIAGNOSIS — R1030 Lower abdominal pain, unspecified: Secondary | ICD-10-CM

## 2015-05-06 LAB — COMPREHENSIVE METABOLIC PANEL
ALBUMIN: 4.2 g/dL (ref 3.5–5.0)
ALT: 13 U/L — ABNORMAL LOW (ref 14–54)
AST: 22 U/L (ref 15–41)
Alkaline Phosphatase: 46 U/L (ref 38–126)
Anion gap: 10 (ref 5–15)
BUN: 12 mg/dL (ref 6–20)
CO2: 25 mmol/L (ref 22–32)
CREATININE: 0.77 mg/dL (ref 0.44–1.00)
Calcium: 9.4 mg/dL (ref 8.9–10.3)
Chloride: 105 mmol/L (ref 101–111)
GLUCOSE: 85 mg/dL (ref 65–99)
POTASSIUM: 4.2 mmol/L (ref 3.5–5.1)
Sodium: 140 mmol/L (ref 135–145)
Total Bilirubin: 0.4 mg/dL (ref 0.3–1.2)
Total Protein: 8 g/dL (ref 6.5–8.1)

## 2015-05-06 LAB — CBC WITH DIFFERENTIAL/PLATELET
BASOS ABS: 0 10*3/uL (ref 0.0–0.1)
BASOS PCT: 0 % (ref 0–1)
EOS ABS: 0.2 10*3/uL (ref 0.0–0.7)
Eosinophils Relative: 3 % (ref 0–5)
HEMATOCRIT: 36.6 % (ref 36.0–46.0)
HEMOGLOBIN: 11.1 g/dL — AB (ref 12.0–15.0)
Lymphocytes Relative: 28 % (ref 12–46)
Lymphs Abs: 2.1 10*3/uL (ref 0.7–4.0)
MCH: 23.8 pg — AB (ref 26.0–34.0)
MCHC: 30.3 g/dL (ref 30.0–36.0)
MCV: 78.4 fL (ref 78.0–100.0)
MONOS PCT: 9 % (ref 3–12)
Monocytes Absolute: 0.7 10*3/uL (ref 0.1–1.0)
NEUTROS PCT: 60 % (ref 43–77)
Neutro Abs: 4.5 10*3/uL (ref 1.7–7.7)
PLATELETS: 381 10*3/uL (ref 150–400)
RBC: 4.67 MIL/uL (ref 3.87–5.11)
RDW: 15.8 % — AB (ref 11.5–15.5)
WBC: 7.5 10*3/uL (ref 4.0–10.5)

## 2015-05-06 LAB — URINALYSIS, ROUTINE W REFLEX MICROSCOPIC
BILIRUBIN URINE: NEGATIVE
GLUCOSE, UA: NEGATIVE mg/dL
Hgb urine dipstick: NEGATIVE
KETONES UR: NEGATIVE mg/dL
LEUKOCYTES UA: NEGATIVE
NITRITE: NEGATIVE
PH: 6 (ref 5.0–8.0)
PROTEIN: NEGATIVE mg/dL
Specific Gravity, Urine: 1.031 — ABNORMAL HIGH (ref 1.005–1.030)
UROBILINOGEN UA: 1 mg/dL (ref 0.0–1.0)

## 2015-05-06 LAB — WET PREP, GENITAL
Clue Cells Wet Prep HPF POC: NONE SEEN
Trich, Wet Prep: NONE SEEN
Yeast Wet Prep HPF POC: NONE SEEN

## 2015-05-06 LAB — POC URINE PREG, ED: PREG TEST UR: NEGATIVE

## 2015-05-06 MED ORDER — IOHEXOL 300 MG/ML  SOLN
50.0000 mL | Freq: Once | INTRAMUSCULAR | Status: AC | PRN
  Administered 2015-05-06: 50 mL via ORAL

## 2015-05-06 NOTE — ED Notes (Signed)
Pt. Does not need to use restroom at this time. Aware urine sample is needed.

## 2015-05-06 NOTE — ED Notes (Signed)
MD at bedside. 

## 2015-05-06 NOTE — ED Notes (Signed)
Pt presents with c/o abdominal pain that initially started last night but has gotten progressively worse throughout the day today. Pt denies any N/V/D.

## 2015-05-06 NOTE — ED Provider Notes (Signed)
CSN: [id number]     Arrival date & time 05/06/15  1936 History   First MD Initiated Contact with Patient 05/06/15 2007     Chief Complaint  Patient presents with  . Abdominal Pain     Patient is a 29 y.o. female presenting with abdominal pain. The history is provided by the patient. No language interpreter was used.  Abdominal Pain  Ms. Koop presents for evaluation of abdominal pain.  Last night around 11pm she developed lower abdominal pain described as a muscular cramp.  The pain is constant in nature and worse with movement, urination and bowel movements.  The pain is midline lower abdomen.  She denies associated fevers, nausea, vomiting, diarrhea, constipation, vaginal discharge.  Sxs are moderate, constant, worsening.    Past Medical History  Diagnosis Date  . Psoriasis of scalp   . Eczema     arms and neck  . Dysmenorrhea   . Fibroid   . Heart murmur     dx'd as a teenager and told would outgrow  . Hormone disorder     enlarged thyroid  . STD (sexually transmitted disease)     Tx'd for Chlamydia and Gonorrhea age 32  . Seasonal allergies    Past Surgical History  Procedure Laterality Date  . Eye surgery      to remove styes at age 80  . Wisdom teeth Bilateral    Family History  Problem Relation Age of Onset  . Hypertension Mother   . Thyroid disease Mother   . Diabetes Father   . Hypertension Father   . Diabetes Maternal Grandmother   . Hypertension Maternal Grandmother   . Stroke Maternal Grandmother   . Diabetes Maternal Grandfather   . Hypertension Maternal Grandfather   . Hypertension Paternal Grandmother   . Hypertension Paternal Grandfather    History  Substance Use Topics  . Smoking status: Never Smoker   . Smokeless tobacco: Never Used  . Alcohol Use: No   OB History    Gravida Para Term Preterm AB TAB SAB Ectopic Multiple Living   0              Review of Systems  Gastrointestinal: Positive for abdominal pain.  All other systems reviewed  and are negative.     Allergies  Review of patient's allergies indicates no known allergies.  Home Medications   Prior to Admission medications   Medication Sig Start Date End Date Taking? Authorizing Provider  acetaminophen (TYLENOL) 500 MG tablet Take 500 mg by mouth every 6 (six) hours as needed.    Historical Provider, MD  FLUOCINOLONE ACETONIDE SCALP 0.01 % external oil Apply 1 application topically every 14 (fourteen) days. 07/14/13   Historical Provider, MD  ibuprofen (ADVIL,MOTRIN) 800 MG tablet Take 1 tablet (800 mg total) by mouth 3 (three) times daily. 03/09/14   Montine Circle, PA-C  metroNIDAZOLE (METROGEL) 0.75 % vaginal gel Place 1 Applicatorful vaginally at bedtime. 04/25/14   [facility] Oletta Lamas, MD   BP 135/86 mmHg  Pulse 71  Temp(Src) 98.1 F (36.7 C) (Oral)  Resp 18  SpO2 100%  LMP 04/28/2015 (Approximate) Physical Exam  Constitutional: She is oriented to person, place, and time. She appears well-developed and well-nourished.  HENT:  Head: Normocephalic and atraumatic.  Cardiovascular: Normal rate and regular rhythm.   No murmur heard. Pulmonary/Chest: Effort normal and breath sounds normal. No respiratory distress.  Abdominal: Soft. There is no rebound and no guarding.  Moderate suprapubic tenderness  Genitourinary: Vagina normal. No vaginal discharge found.  No CMT, os closed  Musculoskeletal: She exhibits no edema or tenderness.  Neurological: She is alert and oriented to person, place, and time.  Skin: Skin is warm and dry.  Psychiatric: She has a normal mood and affect. Her behavior is normal.  Nursing note and vitals reviewed.   ED Course  Procedures (including critical care time) Labs Review Labs Reviewed  WET PREP, GENITAL - Abnormal; Notable for the following:    WBC, Wet Prep HPF POC FEW (*)    All other components within normal limits  CBC WITH DIFFERENTIAL/PLATELET - Abnormal; Notable for the following:    Hemoglobin 11.1 (*)    MCH  23.8 (*)    RDW 15.8 (*)    All other components within normal limits  COMPREHENSIVE METABOLIC PANEL - Abnormal; Notable for the following:    ALT 13 (*)    All other components within normal limits  URINALYSIS, ROUTINE W REFLEX MICROSCOPIC (NOT AT [facility]) - Abnormal; Notable for the following:    APPearance CLOUDY (*)    Specific Gravity, Urine 1.031 (*)    All other components within normal limits  POC URINE PREG, ED  GC/CHLAMYDIA PROBE AMP ([place]) NOT AT [facility]    Imaging Review No results found.   EKG Interpretation None      MDM   Final diagnoses:  None    Patient here for evaluation of lower abdominal pain. Patient has moderate lower abdominal tenderness on examination. Pelvic exam is benign, presentation is not consistent with PID or cervicitis. CT abdomen pending to rule out appendicitis.    Quintella Reichert, MD 05/07/15 [phone]

## 2015-05-07 MED ORDER — HYDROCODONE-ACETAMINOPHEN 5-325 MG PO TABS: 1.0000 | ORAL_TABLET | Freq: Four times a day (QID) | ORAL | Status: DC | PRN

## 2015-05-07 MED ORDER — IOHEXOL 300 MG/ML  SOLN
100.0000 mL | Freq: Once | INTRAMUSCULAR | Status: AC | PRN
  Administered 2015-05-07: 100 mL via INTRAVENOUS

## 2015-05-07 NOTE — Discharge Instructions (Signed)
Uterine Fibroid A uterine fibroid is a growth (tumor) that occurs in your uterus. This type of tumor is not cancerous and does not spread out of the uterus. You can have one or many fibroids. Fibroids can vary in size, weight, and where they grow in the uterus. Some can become quite large. Most fibroids do not require medical treatment, but some can cause pain or heavy bleeding during and between periods. CAUSES  A fibroid is the result of a single uterine cell that keeps growing (unregulated), which is different than most cells in the human body. Most cells have a control mechanism that keeps them from reproducing without control.  SIGNS AND SYMPTOMS   Bleeding.  Pelvic pain and pressure.  Bladder problems due to the size of the fibroid.  Infertility and miscarriages depending on the size and location of the fibroid. DIAGNOSIS  Uterine fibroids are diagnosed through a physical exam. Your health care provider may feel the lumpy tumors during a pelvic exam. Ultrasonography may be done to get information regarding size, location, and number of tumors.  TREATMENT   Your health care provider may recommend watchful waiting. This involves getting the fibroid checked by your health care provider to see if it grows or shrinks.   Hormone treatment or an intrauterine device (IUD) may be prescribed.   Surgery may be needed to remove the fibroids (myomectomy) or the uterus (hysterectomy). This depends on your situation. When fibroids interfere with fertility and a woman wants to become pregnant, a health care provider may recommend having the fibroids removed.  HOME CARE INSTRUCTIONS  Home care depends on how you were treated. In general:   Keep all follow-up appointments with your health care provider.   Only take over-the-counter or prescription medicines as directed by your health care provider. If you were prescribed a hormone treatment, take the hormone medicines exactly as directed. Do not  take aspirin. It can cause bleeding.   Talk to your health care provider about taking iron pills.  If your periods are troublesome but not so heavy, lie down with your feet raised slightly above your heart. Place cold packs on your lower abdomen.   If your periods are heavy, write down the number of pads or tampons you use per month. Bring this information to your health care provider.   Include green vegetables in your diet.  SEEK IMMEDIATE MEDICAL CARE IF:  You have pelvic pain or cramps not controlled with medicines.   You have a sudden increase in pelvic pain.   You have an increase in bleeding between and during periods.   You have excessive periods and soak tampons or pads in a half hour or less.  You feel lightheaded or have fainting episodes. Document Released: 10/18/2000 Document Revised: 08/11/2013 Document Reviewed: 05/20/2013 ExitCare Patient Information 2015 [employer]. This information is not intended to replace advice given to you by your health care provider. Make sure you discuss any questions you have with your health care provider.  

## 2015-05-09 ENCOUNTER — Telehealth: Payer: Self-pay | Admitting: Obstetrics and Gynecology

## 2015-05-09 DIAGNOSIS — D259 Leiomyoma of uterus, unspecified: Secondary | ICD-10-CM

## 2015-05-09 LAB — GC/CHLAMYDIA PROBE AMP (~~LOC~~) NOT AT ARMC
Chlamydia: NEGATIVE
Neisseria Gonorrhea: NEGATIVE

## 2015-05-09 NOTE — Telephone Encounter (Signed)
Patient went to the ER this weekend for abdominal pain. She was told to follow up with her GYN. She is still having abdominal pain and wants to schedule an appointment.

## 2015-05-09 NOTE — Telephone Encounter (Signed)
CT report reviewed.  Has enlarging fibroids.  Also has small umbilical hernia with fat and knuckle of bowel in it, but no signs of obstruction.  Urine was negative.   If having nausea and vomiting along with the pain, needs to return to the ER to rule out strangulated bowel.  If having pain, needs office visit. Can be seen with other provider today in office today and then follow up with me for ultrasound on Thursday to look for ovarian cysts and degenerating fibroids. Can otherwise be seen by me tomorrow.   Will need to be seen if she is requesting a change in pain medication.

## 2015-05-09 NOTE — Telephone Encounter (Signed)
Spoke with patient. Patient states she woke up on Saturday 05/06/2015 and was having abdominal pain and pain with urination. States pain was so increased she could not walk. Patient was seen in ER on 05/06/2015. Notes are in EPIC. Was advised her fibroid has grown since last ultrasound with Dr.Silva. "Because of where the fibroid is it is causing me pain when I use the bathroom." CT scan available in EPIC for review. Was provided hydrocodone for pain but states it makes her nauseous. Has tried taking Ibuprofen with no relief. States pain is a 7/10. Patient is available all week for an appointment. Advised I will speak with Dr.Silva regarding recommended follow up and return call to schedule appointment. Patient is agreeable.

## 2015-05-09 NOTE — Telephone Encounter (Signed)
Spoke with patient. Advised of message as seen below from [place]. Patient declines appointment today with another provider. Appointment scheduled for tomorrow 05/10/2015 at 10am with Dr.Silva. Patient is agreeable to date and time. PUS appointment scheduled for 05/11/2015 at 8:30am with 9am consult with Dr.Silva. Patient is agreeable and verbalizes understanding. Patient aware if pain worsens or develops new symptoms will need to call to be seen today with another provider. If she develops nausea and vomiting with pain knows will need to be seen at ER. PUS order placed for precert.  Routing to provider for final review. Patient agreeable to disposition. Will close encounter.   Patient aware provider will review message and nurse will return call if any additional advice or change of disposition.

## 2015-05-10 ENCOUNTER — Telehealth: Payer: Self-pay | Admitting: Obstetrics and Gynecology

## 2015-05-10 ENCOUNTER — Encounter: Payer: Self-pay | Admitting: Obstetrics and Gynecology

## 2015-05-10 ENCOUNTER — Ambulatory Visit (INDEPENDENT_AMBULATORY_CARE_PROVIDER_SITE_OTHER): Payer: BC Managed Care – PPO | Admitting: Obstetrics and Gynecology

## 2015-05-10 VITALS — BP 114/84 | HR 60 | Resp 16 | Ht 62.5 in | Wt 186.8 lb

## 2015-05-10 DIAGNOSIS — D259 Leiomyoma of uterus, unspecified: Secondary | ICD-10-CM

## 2015-05-10 NOTE — Telephone Encounter (Signed)
Spoke with patient. Agreeable to benefit. Confirmed appointment time for 05/11/15 Ok to close

## 2015-05-10 NOTE — Progress Notes (Signed)
Patient ID: Allison Bentley, female   DOB: 01/22/1986, 29 y.o.   MRN: [id number] GYNECOLOGY  VISIT   HPI: 29 y.o.   Single  African American  female   G0P0 with Patient's last menstrual period was 04/28/2015 (approximate).  Normal menses.  Heavy 2nd - 3rd day.  Not staining through clothes.  here for lower pelvic pain due to increasing size of fibroids which was noted on CT scan at [facility] 05-07-15 when patient was seen in ER. Went to the ER on 05/05/13 for abdominal pain that felt like a muscular pain.   Pain was instant onset. Had pressure type pain with urination as well.  UA was normal there.  Treated with Vicodin, but this caused nausea.  Has continued to have normal bowel function.   Not sexually active since last year. Negative GC and CT at hospital.  Considering future childbearing.  Pain is now more on the right side.  Pain is now a 2 or 3.  CT scan on 05/06/15.   CLINICAL DATA: Abdominal pain, onset last night. Progressively worsening today.  EXAM: CT ABDOMEN AND PELVIS WITH CONTRAST  TECHNIQUE: Multidetector CT imaging of the abdomen and pelvis was performed using the standard protocol following bolus administration of intravenous contrast.  CONTRAST: 40mL OMNIPAQUE IOHEXOL 300 MG/ML SOLN, 148mL OMNIPAQUE IOHEXOL 300 MG/ML SOLN  COMPARISON: None.  FINDINGS: Lower chest: No significant abnormality  Hepatobiliary: There are normal appearances of the liver, gallbladder and bile ducts.  Pancreas: Normal  Spleen: Normal  Adrenals/Urinary Tract: The adrenals and kidneys are normal in appearance. There is no urinary calculus evident. There is no hydronephrosis or ureteral dilatation. Collecting systems and ureters appear unremarkable.  Stomach/Bowel: There are normal appearances of the stomach, small bowel and colon. The appendix is normal.  Vascular/Lymphatic: The abdominal aorta is normal in caliber. There is no atherosclerotic  calcification. There is no adenopathy in the abdomen or pelvis.  Reproductive: There are multiple uterine fibroids, the largest measuring 9.7 cm. The uterus is markedly enlarged. No adnexal abnormalities are evident.  Other: There is no acute inflammatory change in the abdomen or pelvis. There is no ascites.  Musculoskeletal: There is a small umbilical hernia containing fat and a knuckle of unobstructed small bowel. No significant skeletal lesions.  IMPRESSION: 1. Markedly enlarged multi fibroid uterus. The largest fibroid measures 9.7 cm. 2. There is a knuckle of unobstructed small bowel protruding into a small umbilical hernia. 3. No acute findings are evident in the abdomen or pelvis.   Electronically Signed  By: Andreas Newport M.D.  On: 05/07/2015 01:14   GYNECOLOGIC HISTORY: Patient's last menstrual period was 04/28/2015 (approximate). Contraception: Abstinence Menopausal hormone therapy: n/a Last mammogram: n/a Last pap smear: 10/2012 normal--hx of colposcopy 2009 but no treatment to cervix.        OB History    Gravida Para Term Preterm AB TAB SAB Ectopic Multiple Living   0                  Patient Active Problem List   Diagnosis Date Noted  . Fibroids 08/06/2013    Past Medical History  Diagnosis Date  . Psoriasis of scalp   . Eczema     arms and neck  . Dysmenorrhea   . Fibroid   . Heart murmur     dx'd as a teenager and told would outgrow  . Hormone disorder     enlarged thyroid  . STD (sexually transmitted disease)  Tx'd for Chlamydia and Gonorrhea age 29  . Seasonal allergies     Past Surgical History  Procedure Laterality Date  . Eye surgery      to remove styes at age 29  . Wisdom teeth Bilateral     Current Outpatient Prescriptions  Medication Sig Dispense Refill  . acetaminophen (TYLENOL) 500 MG tablet Take 500 mg by mouth every 6 (six) hours as needed for moderate pain.     Marland Kitchen FLUOCINOLONE ACETONIDE SCALP 0.01 % OIL  Apply 1 application topically as needed. Apply to itchy scalp    . ibuprofen (ADVIL,MOTRIN) 800 MG tablet Take 1 tablet (800 mg total) by mouth 3 (three) times daily. 21 tablet 0  . HYDROcodone-acetaminophen (NORCO/VICODIN) 5-325 MG per tablet Take 1 tablet by mouth every 6 (six) hours as needed. (Patient not taking: Reported on 05/10/2015) 15 tablet 0   No current facility-administered medications for this visit.     ALLERGIES: Review of patient's allergies indicates no known allergies.  Family History  Problem Relation Age of Onset  . Hypertension Mother   . Thyroid disease Mother   . Diabetes Father   . Hypertension Father   . Diabetes Maternal Grandmother   . Hypertension Maternal Grandmother   . Stroke Maternal Grandmother   . Diabetes Maternal Grandfather   . Hypertension Maternal Grandfather   . Hypertension Paternal Grandmother   . Hypertension Paternal Grandfather     History   Social History  . Marital Status: Single    Spouse Name: N/A  . Number of Children: N/A  . Years of Education: N/A   Occupational History  . Not on file.   Social History Main Topics  . Smoking status: Never Smoker   . Smokeless tobacco: Never Used  . Alcohol Use: No  . Drug Use: No  . Sexual Activity: No   Other Topics Concern  . Not on file   Social History Narrative    ROS:  Pertinent items are noted in HPI.  PHYSICAL EXAMINATION:    BP 114/84 mmHg  Pulse 60  Resp 16  Ht 5' 2.5" (1.588 m)  Wt 186 lb 12.8 oz (84.732 kg)  BMI 33.60 kg/m2  LMP 04/28/2015 (Approximate)    General appearance: alert, cooperative and appears stated age Abdomen: Uterus to umbilicus - feels more tall and thin than wide, soft, non-tender; bowel sounds normal.   Pelvic: External genitalia:  no lesions              Urethra:  normal appearing urethra with no masses, tenderness or lesions              Bartholins and Skenes: normal                 Vagina: normal appearing vagina with normal color and  discharge, no lesions              Cervix: no lesions                Bimanual Exam:  Uterus:  enlarged,  21 weeks size and nontender.               Adnexa: no mass, fullness, tenderness              Rectovaginal: Yes.  .  Confirms.              Anus:  normal sphincter tone, no lesions  Chaperone was present for exam.  ASSESSMENT  Uterine fibroids - enlarging.  Sudden pelvic pain episode.  I suspect patient had a ruptured ovarian cyst.  No acute abdomen.  Small umbilical hernia.   PLAN  Counseled regarding fibroids and ovarian cysts.  Will return tomorrow for pelvic ultrasound and further discussion of possible myomectomy.    An After Visit Summary was printed and given to the patient.  ___25___ minutes face to face time of which over 50% was spent in counseling.

## 2015-05-10 NOTE — Telephone Encounter (Signed)
Called patient to review benefits for procedure. Left voicemail to call back and review. °

## 2015-05-11 ENCOUNTER — Encounter: Payer: Self-pay | Admitting: Obstetrics and Gynecology

## 2015-05-11 ENCOUNTER — Ambulatory Visit (INDEPENDENT_AMBULATORY_CARE_PROVIDER_SITE_OTHER): Payer: BC Managed Care – PPO | Admitting: Obstetrics and Gynecology

## 2015-05-11 ENCOUNTER — Ambulatory Visit (INDEPENDENT_AMBULATORY_CARE_PROVIDER_SITE_OTHER): Payer: BC Managed Care – PPO

## 2015-05-11 VITALS — BP 120/88 | HR 70 | Ht 62.5 in | Wt 186.0 lb

## 2015-05-11 DIAGNOSIS — D252 Subserosal leiomyoma of uterus: Secondary | ICD-10-CM

## 2015-05-11 DIAGNOSIS — D259 Leiomyoma of uterus, unspecified: Secondary | ICD-10-CM | POA: Diagnosis not present

## 2015-05-11 NOTE — Progress Notes (Signed)
Subjective  29 y.o. G0P0    African American female here for pelvic ultrasound for recheck of fibroids.  Had sudden pain episode during 4th of July weekend and presented to the emergency department and was diagnosed with enlarging fibroids.  No ovarian pathology identified.  Noted to have small umbilical hernia with fat and small amount of small bowel in hernia but no obstruction.  Had negative GC/CT and negative UPT.  Seen in office yesterday and pain was much improved.  Some right sided discomfort.  Feels the fibroid more.   Patient is a Pharmacist, hospital. Would consider surgery in the summer next year.  Would like to consider future childbearing.   Objective  Pelvic ultrasound images and report reviewed with patient.  Uterus - 3 subserosal fibroids - 9.0 cm fundal, 3.1 cm anterior, 3.96 cm posterior. EMS - 11.28 cm EMS. Ovaries - 3 cm right ovarian thin walled cyst with hemorrhagic CL cyst.  Left ovary with follicles.  Free fluid - no       Assessment  3 subserosal fibroids, enlarging. No distortion of uterine cavity.  Right hemorrhagic ovarian cyst. Desire for future fertility.   Plan  Discussion of fibroids and natural history.  Discussed treatment of fibroid symptoms - hormonal contraceptives to control bleeding, surgery to treat volume issues.  Discussed robotic laparoscopic myomectomy and abdominal myomectomy - risks and benefits of each approach.  Discussed possible need for future Cesarean section depending on the depth of myomectomy incisions. ACO[employer] handout on fibroids.  Return for annual exam in 2 months. May choose to do a recheck 6 months following this.   ___25____ minutes face to face time of which over 50% was spent in counseling.   After visit summary to patient.

## 2015-05-11 NOTE — Patient Instructions (Signed)
Please call for questions.  I am happy to help if you decide to pursue myomectomy

## 2015-07-21 ENCOUNTER — Ambulatory Visit (INDEPENDENT_AMBULATORY_CARE_PROVIDER_SITE_OTHER): Payer: BC Managed Care – PPO | Admitting: Obstetrics and Gynecology

## 2015-07-21 ENCOUNTER — Encounter: Payer: Self-pay | Admitting: Obstetrics and Gynecology

## 2015-07-21 ENCOUNTER — Other Ambulatory Visit: Payer: Self-pay | Admitting: Obstetrics and Gynecology

## 2015-07-21 VITALS — BP 118/74 | HR 78 | Resp 14 | Ht 62.5 in | Wt 187.0 lb

## 2015-07-21 DIAGNOSIS — Z01419 Encounter for gynecological examination (general) (routine) without abnormal findings: Secondary | ICD-10-CM | POA: Diagnosis not present

## 2015-07-21 DIAGNOSIS — Z113 Encounter for screening for infections with a predominantly sexual mode of transmission: Secondary | ICD-10-CM | POA: Diagnosis not present

## 2015-07-21 DIAGNOSIS — D259 Leiomyoma of uterus, unspecified: Secondary | ICD-10-CM

## 2015-07-21 LAB — CBC
HCT: 32.5 % — ABNORMAL LOW (ref 36.0–46.0)
HEMOGLOBIN: 10.4 g/dL — AB (ref 12.0–15.0)
MCH: 23.9 pg — AB (ref 26.0–34.0)
MCHC: 32 g/dL (ref 30.0–36.0)
MCV: 74.5 fL — AB (ref 78.0–100.0)
MPV: 8.8 fL (ref 8.6–12.4)
Platelets: 402 10*3/uL — ABNORMAL HIGH (ref 150–400)
RBC: 4.36 MIL/uL (ref 3.87–5.11)
RDW: 16.4 % — ABNORMAL HIGH (ref 11.5–15.5)
WBC: 7.2 10*3/uL (ref 4.0–10.5)

## 2015-07-21 LAB — COMPREHENSIVE METABOLIC PANEL
ALK PHOS: 42 U/L (ref 33–115)
ALT: 11 U/L (ref 6–29)
AST: 17 U/L (ref 10–30)
Albumin: 4.3 g/dL (ref 3.6–5.1)
BUN: 15 mg/dL (ref 7–25)
CO2: 26 mmol/L (ref 20–31)
Calcium: 9.3 mg/dL (ref 8.6–10.2)
Chloride: 104 mmol/L (ref 98–110)
Creat: 0.74 mg/dL (ref 0.50–1.10)
GLUCOSE: 85 mg/dL (ref 65–99)
POTASSIUM: 4 mmol/L (ref 3.5–5.3)
Sodium: 137 mmol/L (ref 135–146)
Total Bilirubin: 0.3 mg/dL (ref 0.2–1.2)
Total Protein: 7.3 g/dL (ref 6.1–8.1)

## 2015-07-21 LAB — LIPID PANEL
Cholesterol: 160 mg/dL (ref 125–200)
HDL: 41 mg/dL — ABNORMAL LOW (ref >=46)
LDL CALC: 95 mg/dL (ref <130)
TRIGLYCERIDES: 118 mg/dL (ref <150)
Total CHOL/HDL Ratio: 3.9 Ratio (ref <=5.0)
VLDL: 24 mg/dL (ref <30)

## 2015-07-21 NOTE — Progress Notes (Signed)
29 y.o. G0P0 Single African American female here for annual exam.    Menses a little heavy.  Pad change 3 times a day.  Crampy a little bit.  No pain since July. Would like an opportunity for childbearing in the future.   Pelvic ultrasound 05/11/15: Uterus - 3 subserosal fibroids - 9.0 cm fundal, 3.1 cm anterior, 3.96 cm posterior. EMS - 11.28 cm EMS. Ovaries - 3 cm right ovarian thin walled cyst with hemorrhagic CL cyst. Left ovary with follicles.  Free fluid - no  Concerns about her weight.   PCP:   [facility]  Patient's last menstrual period was 07/11/2015 (exact date).          Sexually active: No.   Female and female partners. The current method of family planning is none.    Exercising: No.  n/a Smoker:  no  Health Maintenance: Pap:  Uncertain when last pap was done.  History of abnormal Pap:  no MMG:  none Colonoscopy:  none BMD:   none  Result  none TDaP:  Pt is unsure, at PCP office Screening Labs:  PCP Hb today: PCP, Urine today: PCP   reports that she has never smoked. She has never used smokeless tobacco. She reports that she does not drink alcohol or use illicit drugs.  Past Medical History  Diagnosis Date  . Psoriasis of scalp   . Eczema     arms and neck  . Dysmenorrhea   . Fibroid   . Heart murmur     dx'd as a teenager and told would outgrow  . Hormone disorder     enlarged thyroid  . STD (sexually transmitted disease)     Tx'd for Chlamydia and Gonorrhea age 79  . Seasonal allergies     Past Surgical History  Procedure Laterality Date  . Eye surgery      to remove styes at age 8  . Wisdom teeth Bilateral     Current Outpatient Prescriptions  Medication Sig Dispense Refill  . acetaminophen (TYLENOL) 500 MG tablet Take 500 mg by mouth every 6 (six) hours as needed for moderate pain.     Marland Kitchen FLUOCINOLONE ACETONIDE SCALP 0.01 % OIL Apply 1 application topically as needed. Apply to itchy scalp    . HYDROcodone-acetaminophen  (NORCO/VICODIN) 5-325 MG per tablet Take 1 tablet by mouth every 6 (six) hours as needed. 15 tablet 0  . ibuprofen (ADVIL,MOTRIN) 800 MG tablet Take 1 tablet (800 mg total) by mouth 3 (three) times daily. 21 tablet 0   No current facility-administered medications for this visit.    Family History  Problem Relation Age of Onset  . Hypertension Mother   . Thyroid disease Mother   . Diabetes Father   . Hypertension Father   . Diabetes Maternal Grandmother   . Hypertension Maternal Grandmother   . Stroke Maternal Grandmother   . Diabetes Maternal Grandfather   . Hypertension Maternal Grandfather   . Hypertension Paternal Grandmother   . Hypertension Paternal Grandfather     ROS:  Pertinent items are noted in HPI.  Otherwise, a comprehensive ROS was negative.  Exam:   There were no vitals taken for this visit.    General appearance: alert, cooperative and appears stated age Head: Normocephalic, without obvious abnormality, atraumatic Neck: no adenopathy, supple, symmetrical, trachea midline and thyroid normal to inspection and palpation Lungs: clear to auscultation bilaterally Breasts: normal appearance, no masses or tenderness, Inspection negative, No nipple retraction or dimpling, No  nipple discharge or bleeding, No axillary or supraclavicular adenopathy Heart: regular rate and rhythm Abdomen: 22 week size mass - larger of the right than on the left, soft, non-tender; bowel sounds normal. Extremities: extremities normal, atraumatic, no cyanosis or edema Skin: Skin color, texture, turgor normal. No rashes or lesions Lymph nodes: Cervical, supraclavicular, and axillary nodes normal. No abnormal inguinal nodes palpated Neurologic: Grossly normal  Pelvic: External genitalia:  no lesions              Urethra:  normal appearing urethra with no masses, tenderness or lesions              Bartholins and Skenes: normal                 Vagina: normal appearing vagina with normal color and  discharge, no lesions              Cervix: no lesions              Pap taken: Yes.   Bimanual Exam:  Uterus:  enlarged,  22 weeks size              Adnexa: no mass, fullness, tenderness and Exam limited by large size of uterus.               Rectovaginal: No..  Confirms.              Anus:  normal sphincter tone, no lesions  Chaperone was present for exam.  Assessment:   Well woman visit with normal exam. Large uterine fibroids.   Plan: Yearly mammogram recommended after age 65.  Recommended self breast exam.  Pap and HR HPV as above. Discussed Calcium, Vitamin D, regular exercise program including cardiovascular and weight bearing exercise. Labs performed.  Yes.  STD screening and routine labs including TSH.  Refills given on medications.  No..    Discussed proper diet and exercise.  Follow up annually and prn.   Additional counseling given regarding large fibroid uterus and plan for follow up in 6 months to evaluate for potential myomectomy.   After visit summary provided.

## 2015-07-21 NOTE — Patient Instructions (Signed)

## 2015-07-22 LAB — STD PANEL
HIV 1&2 Ab, 4th Generation: NONREACTIVE
Hepatitis B Surface Ag: NEGATIVE

## 2015-07-22 LAB — HEPATITIS C ANTIBODY: HCV AB: NEGATIVE

## 2015-07-24 LAB — FERRITIN: Ferritin: 8 ng/mL — ABNORMAL LOW (ref 10–291)

## 2015-07-24 LAB — TSH: TSH: 0.674 u[IU]/mL (ref 0.350–4.500)

## 2015-07-24 LAB — IRON: Iron: 18 ug/dL — ABNORMAL LOW (ref 40–190)

## 2015-07-25 LAB — IPS PAP TEST WITH REFLEX TO HPV

## 2015-07-26 ENCOUNTER — Other Ambulatory Visit: Payer: Self-pay | Admitting: Obstetrics and Gynecology

## 2015-07-26 DIAGNOSIS — D509 Iron deficiency anemia, unspecified: Secondary | ICD-10-CM

## 2015-07-27 ENCOUNTER — Telehealth: Payer: Self-pay

## 2015-07-27 NOTE — Telephone Encounter (Signed)
Dr. Quincy Simmonds, just wanted to let you know the lab is not able to do GC/CT on pap specimen was pap smear has been done.  Do you want me to call patient and have her come back by office to collect urine specimen for GC/CT?  Routed to Dr. Quincy Simmonds

## 2015-07-27 NOTE — Telephone Encounter (Signed)
Please contact patient to come for a visit for a nonclean catch urine specimen for GC/CT.   Patient will be charged for the lab but should not be charged a nursing visit.

## 2015-07-31 ENCOUNTER — Telehealth: Payer: Self-pay

## 2015-07-31 NOTE — Telephone Encounter (Signed)
-----   Message from Nunzio Cobbs, MD sent at 07/26/2015  8:53 PM EDT ----- Results to patient through My Chart. Will start FeSO4 for iron deficiency and do a lab visit in 6 weeks.  I added GC/CT to her pap.

## 2015-07-31 NOTE — Telephone Encounter (Signed)
Called patient at [phone] to discuss lab results and to advise patient GC/CT could not be added to pap.  Dr. Quincy Simmonds would like her to return to office for a non-clean catch specimen so we can do GC/CT.  Left message on voicemail to call me back.

## 2015-07-31 NOTE — Telephone Encounter (Signed)
See result note of 07-31-15.

## 2015-07-31 NOTE — Telephone Encounter (Signed)
Patient returned my call and she will come in tomorrow and collect urine specimen for GC/CT. Lab appointment made for 3:30pm.

## 2015-08-01 ENCOUNTER — Other Ambulatory Visit (INDEPENDENT_AMBULATORY_CARE_PROVIDER_SITE_OTHER): Payer: BC Managed Care – PPO

## 2015-08-01 DIAGNOSIS — Z113 Encounter for screening for infections with a predominantly sexual mode of transmission: Secondary | ICD-10-CM

## 2015-08-02 LAB — GC/CHLAMYDIA PROBE AMP, URINE
Chlamydia, Swab/Urine, PCR: NEGATIVE
GC PROBE AMP, URINE: NEGATIVE

## 2015-09-05 ENCOUNTER — Other Ambulatory Visit: Payer: BC Managed Care – PPO

## 2015-09-08 ENCOUNTER — Other Ambulatory Visit (INDEPENDENT_AMBULATORY_CARE_PROVIDER_SITE_OTHER): Payer: BC Managed Care – PPO

## 2015-09-08 DIAGNOSIS — D509 Iron deficiency anemia, unspecified: Secondary | ICD-10-CM

## 2015-09-08 LAB — FERRITIN: FERRITIN: 10 ng/mL (ref 10–291)

## 2015-09-08 LAB — IRON: Iron: 58 ug/dL (ref 40–190)

## 2015-09-09 LAB — CBC
HCT: 32.6 % — ABNORMAL LOW (ref 36.0–46.0)
Hemoglobin: 10.5 g/dL — ABNORMAL LOW (ref 12.0–15.0)
MCH: 24.2 pg — AB (ref 26.0–34.0)
MCHC: 32.2 g/dL (ref 30.0–36.0)
MCV: 75.3 fL — ABNORMAL LOW (ref 78.0–100.0)
MPV: 9.5 fL (ref 8.6–12.4)
Platelets: 376 10*3/uL (ref 150–400)
RBC: 4.33 MIL/uL (ref 3.87–5.11)
RDW: 16.9 % — AB (ref 11.5–15.5)
WBC: 7.2 10*3/uL (ref 4.0–10.5)

## 2016-01-18 ENCOUNTER — Encounter: Payer: Self-pay | Admitting: Obstetrics and Gynecology

## 2016-01-18 ENCOUNTER — Ambulatory Visit (INDEPENDENT_AMBULATORY_CARE_PROVIDER_SITE_OTHER): Payer: BC Managed Care – PPO | Admitting: Obstetrics and Gynecology

## 2016-01-18 VITALS — BP 108/60 | HR 76 | Ht 62.5 in | Wt 187.4 lb

## 2016-01-18 DIAGNOSIS — D649 Anemia, unspecified: Secondary | ICD-10-CM

## 2016-01-18 DIAGNOSIS — D252 Subserosal leiomyoma of uterus: Secondary | ICD-10-CM | POA: Diagnosis not present

## 2016-01-18 LAB — CBC
HCT: 36.9 % (ref 36.0–46.0)
HEMOGLOBIN: 11.8 g/dL — AB (ref 12.0–15.0)
MCH: 25.1 pg — ABNORMAL LOW (ref 26.0–34.0)
MCHC: 32 g/dL (ref 30.0–36.0)
MCV: 78.5 fL (ref 78.0–100.0)
MPV: 9.4 fL (ref 8.6–12.4)
Platelets: 360 10*3/uL (ref 150–400)
RBC: 4.7 MIL/uL (ref 3.87–5.11)
RDW: 15.2 % (ref 11.5–15.5)
WBC: 6.7 10*3/uL (ref 4.0–10.5)

## 2016-01-18 LAB — FERRITIN: FERRITIN: 8 ng/mL — AB (ref 10–154)

## 2016-01-18 LAB — IRON: Iron: 35 ug/dL — ABNORMAL LOW (ref 40–190)

## 2016-01-18 NOTE — Progress Notes (Signed)
Patient ID: Allison Bentley, female   DOB: Nov 22, 1985, 30 y.o.   MRN: [id number] GYNECOLOGY  VISIT   HPI: 30 y.o.   Single  African American  female   G0P0 with Patient's last menstrual period was 12/28/2015 (exact date).   here for 6 month follow up on fibroids and anemia.   Hgb 10.5 on 09/08/15.  Not taking iron regularly.  Can feel fatigue and washed out on "Mondays." No dizziness or lightheadedness.  Headaches during first day of cycle.  Not a migraine.   Menses are regular every 25 days with pad change eery 2 -3 hours.   Cycles last 5 -6 days.  Feels pressure with movement.  Can void often.  States that the fibroids really don't bother her.  Uterus 22 week size on prior examination.  Pelvic ultrasound 05/11/15: Uterus - 3 subserosal fibroids - 9.0 cm fundal, 3.1 cm anterior, 3.96 cm posterior. EMS - 11.28 cm EMS. Ovaries - 3 cm right ovarian thin walled cyst with hemorrhagic CL cyst. Left ovary with follicles.  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   GYNECOLOGIC HISTORY: Patient's last menstrual period was 12/28/2015 (exact date). Contraception:none Menopausal hormone therapy: n/a Last mammogram: n/a Last pap smear: 07-24-15 Neg (hx colpo 2009;no treatment)        OB History    Gravida Para Term Preterm AB TAB SAB Ectopic Multiple Living   0                  Patient Active Problem List   Diagnosis Date Noted  . Fibroids 08/06/2013    Past Medical History  Diagnosis Date  . Psoriasis of scalp   . Eczema     arms and neck  . Dysmenorrhea   . Fibroid   . Heart murmur     dx'd as a teenager and told would outgrow  . Hormone disorder     enlarged thyroid  . STD (sexually transmitted disease)     Tx'd for Chlamydia and Gonorrhea age 30  . Seasonal allergies     Past Surgical History  Procedure Laterality Date  . Eye surgery      to remove styes at age 30  . Wisdom teeth Bilateral     Current Outpatient Prescriptions  Medication Sig  Dispense Refill  . acetaminophen (TYLENOL) 500 MG tablet Take 500 mg by mouth every 6 (six) hours as needed for moderate pain.     Marland Kitchen FLUOCINOLONE ACETONIDE SCALP 0.01 % OIL Apply 1 application topically as needed. Apply to itchy scalp    . HYDROcodone-acetaminophen (NORCO/VICODIN) 5-325 MG per tablet Take 1 tablet by mouth every 6 (six) hours as needed. 15 tablet 0  . ibuprofen (ADVIL,MOTRIN) 800 MG tablet Take 1 tablet (800 mg total) by mouth 3 (three) times daily. (Patient taking differently: Take 800 mg by mouth every 6 (six) hours as needed. ) 21 tablet 0   No current facility-administered medications for this visit.     ALLERGIES: Review of patient's allergies indicates no known allergies.  Family History  Problem Relation Age of Onset  . Hypertension Mother   . Thyroid disease Mother   . Diabetes Father   . Hypertension Father   . Diabetes Maternal Grandmother   . Hypertension Maternal Grandmother   . Stroke Maternal Grandmother   . Diabetes Maternal Grandfather   . Hypertension Maternal Grandfather   . Hypertension Paternal Grandmother   . Hypertension Paternal Grandfather  Social History   Social History  . Marital Status: Single    Spouse Name: N/A  . Number of Children: N/A  . Years of Education: N/A   Occupational History  . Not on file.   Social History Main Topics  . Smoking status: Never Smoker   . Smokeless tobacco: Never Used  . Alcohol Use: No  . Drug Use: No  . Sexual Activity: No   Other Topics Concern  . Not on file   Social History Narrative    ROS:  Pertinent items are noted in HPI.  PHYSICAL EXAMINATION:    BP 108/60 mmHg  Pulse 76  Ht 5' 2.5" (1.588 m)  Wt 187 lb 6.4 oz (85.004 kg)  BMI 33.71 kg/m2  LMP 12/28/2015 (Exact Date)    General appearance: alert, cooperative and appears stated age  Abdomen: soft, non-tender; uterus palpable to almost 5 cm above umbilicus in midline.  Feels mobile.  Pelvic: External genitalia:  no  lesions              Urethra:  normal appearing urethra with no masses, tenderness or lesions              Bartholins and Skenes: normal                 Vagina: normal appearing vagina with normal color and discharge, no lesions              Cervix: no lesions                Bimanual Exam:  Uterus:  enlarged, 24 weeks size              Adnexa: no mass, fullness, tenderness and not palpable separately from uterus.              Rectovaginal: Yes.  .  Confirms.              Anus:  normal sphincter tone, no lesions  Chaperone was present for exam.  ASSESSMENT  Large uterine fibroids.   Hx anemia.    PLAN  Counseled regarding fibroids and symptoms caused by fibroids.  Discussed treatment options - observation, myomectomy, birth control to control cycles, Depo Lupron for temporary control of bleeding and anemia and shrinkage of fibroids, and uterine artery embolization and hysterectomy for patients who have completed childbearing.  We discussed that doing surgery before the fibroids become much larger does allow for potential transverse versus vertical midline incision.  We did discuss myomectomy does carry risk of potential hysterectomy. Will check CBC.  May need pelvic ultrasound to follow the fibroids objectively and check for hydronephrosis.  I have encouraged patient to continue her plans for international travel!  An After Visit Summary was printed and given to the patient.  _25_____ minutes face to face time of which over 50% was spent in counseling.

## 2016-01-21 ENCOUNTER — Other Ambulatory Visit: Payer: Self-pay | Admitting: Obstetrics and Gynecology

## 2016-01-21 DIAGNOSIS — D259 Leiomyoma of uterus, unspecified: Secondary | ICD-10-CM

## 2016-01-22 ENCOUNTER — Telehealth: Payer: Self-pay | Admitting: Obstetrics and Gynecology

## 2016-01-22 NOTE — Telephone Encounter (Signed)
Spoke with pt regarding benefit for ultrasound. Patient understood and agreeable. Patient ready to schedule. Patient scheduled 02/01/16 with Dr Quincy Simmonds. Pt aware of arrival date and time. Pt aware of 72 hours cancellation policy with [id number] fee. No further questions. Ok to close

## 2016-02-01 ENCOUNTER — Ambulatory Visit (INDEPENDENT_AMBULATORY_CARE_PROVIDER_SITE_OTHER): Payer: BC Managed Care – PPO

## 2016-02-01 ENCOUNTER — Encounter: Payer: Self-pay | Admitting: Obstetrics and Gynecology

## 2016-02-01 ENCOUNTER — Ambulatory Visit (INDEPENDENT_AMBULATORY_CARE_PROVIDER_SITE_OTHER): Payer: BC Managed Care – PPO | Admitting: Obstetrics and Gynecology

## 2016-02-01 VITALS — BP 110/76 | HR 76 | Ht 62.5 in | Wt 185.0 lb

## 2016-02-01 DIAGNOSIS — D259 Leiomyoma of uterus, unspecified: Secondary | ICD-10-CM

## 2016-02-01 NOTE — Progress Notes (Signed)
Subjective  30 y.o. G0P0 Single African American female here for pelvic ultrasound for known uterine fibroids.  Menses a little heavy. Pad change 3 times a day.  Crampy a little bit.  No pain since July.  Would like an opportunity for childbearing in the future.   Uterine size 22 weeks on pelvic and abdominal exam.   Hgb 11.8 on 3/16.17.  Pelvic ultrasound 05/11/15: Uterus - 3 subserosal fibroids - 9.0 cm fundal, 3.1 cm anterior, 3.96 cm posterior. EMS - 11.28 cm EMS. Ovaries - 3 cm right ovarian thin walled cyst with hemorrhagic CL cyst. Left ovary with follicles.  Free fluid - no  Patient's last menstrual period was 01/23/2016 (exact date).  Objective  Technique:  Both transabdominal and transvaginal ultrasound examinations of the pelvis were performed. Transabdominal technique was performed for global imaging of the pelvis including uterus, ovaries, adnexal regions, and pelvic cul-de-sac. It was necessary to proceed with endovaginal exam following the abdominal ultrasound.  Transabdominal exam to visualize the endometrium and adnexa.  Color and duplex Doppler ultrasound was utilized to evaluate blood flow to the ovaries.    Pelvic ultrasound images and report reviewed with patient.  Uterus - 4 fibroids - 8 cm, 5 cm, 2 cm and 2.5 cm.  (previously 3 cm, 4 cm, and 9 cm).  EMS - 10.68 mm. Ovaries - normal. Free fluid - no  Renal ultrasound - no hydronephrosis bilaterally.      Assessment  Multifibroid uterus.  Relatively asymptomatic.  Mild anemia.   Plan  Discussion regarding fibroids and management options - observation, abdominal myomectomy, and laparoscopic robotic myomectomy. Patient has relatively few symptoms.  Surgery is not an urgent issue, but I believe that at some point, the patient will be facing a procedure to care of her fibroids.  I would like for her to have a second opinion regarding myomectomy and timing of procedure - Dr. Kerin Perna.  Repeat  ultrasound in 6 months if not proceeding with myomectomy.  Patient expressing satisfaction with the plan.   ___15____ minutes face to face time of which over 50% was spent in counseling.   After visit summary to patient.

## 2016-02-02 ENCOUNTER — Telehealth: Payer: Self-pay | Admitting: Obstetrics and Gynecology

## 2016-02-02 DIAGNOSIS — D259 Leiomyoma of uterus, unspecified: Secondary | ICD-10-CM

## 2016-02-02 NOTE — Telephone Encounter (Signed)
Please place a referral to Dr. Kerin Perna for uterine fibroids.  I am asking him to see patient as a second opinion regarding her fibroids and timing of myomectomy.

## 2016-02-02 NOTE — Telephone Encounter (Signed)
Referral faxed to Dr Charlett Lango office. Pending appointment.

## 2016-02-02 NOTE — Telephone Encounter (Signed)
Referral placed in Epic to Dr. Kerin Perna.  Routing to cc Kerry Hough referral processing and patient contact.

## 2016-05-17 ENCOUNTER — Ambulatory Visit (INDEPENDENT_AMBULATORY_CARE_PROVIDER_SITE_OTHER): Payer: BC Managed Care – PPO | Admitting: Obstetrics and Gynecology

## 2016-05-17 ENCOUNTER — Encounter: Payer: Self-pay | Admitting: Obstetrics and Gynecology

## 2016-05-17 VITALS — BP 118/80 | HR 78 | Temp 98.2°F | Resp 16 | Ht 62.5 in | Wt 189.0 lb

## 2016-05-17 DIAGNOSIS — Z113 Encounter for screening for infections with a predominantly sexual mode of transmission: Secondary | ICD-10-CM | POA: Diagnosis not present

## 2016-05-17 DIAGNOSIS — R102 Pelvic and perineal pain: Secondary | ICD-10-CM

## 2016-05-17 DIAGNOSIS — D259 Leiomyoma of uterus, unspecified: Secondary | ICD-10-CM

## 2016-05-17 LAB — POCT URINALYSIS DIPSTICK
Bilirubin, UA: NEGATIVE
Blood, UA: NEGATIVE
Glucose, UA: NEGATIVE
KETONES UA: NEGATIVE
Leukocytes, UA: NEGATIVE
Nitrite, UA: NEGATIVE
PROTEIN UA: NEGATIVE
Urobilinogen, UA: NEGATIVE
pH, UA: 5

## 2016-05-17 LAB — POCT URINE PREGNANCY: Preg Test, Ur: NEGATIVE

## 2016-05-17 NOTE — Progress Notes (Signed)
GYNECOLOGY  VISIT   HPI: 30 y.o.   Single  African American  female   G0P0 with Patient's last menstrual period was 05/07/2016.   here for   Vaginitis and STD check.   Returned from [place] with a rash.  Saw dermatology.  Tx with cream.  Noting vaginal discharge, which has stopped.  No burning. No odor now.  New partner in May.  No protection used.  Hx large uterine fibroids. Sometimes has pelvic pain.  No abnormal uterine bleeding.  Can feel the fibroids abdominally more. Has an appt with Dr. Kerin Perna this month.  Desires future fertility.   UPT negative  UA negative.   GYNECOLOGIC HISTORY: Patient's last menstrual period was 05/07/2016. Contraception:  Abstinence for ~ 2 months.  Menopausal hormone therapy:  None Last mammogram:  None Last pap smear:   07/24/15 Neg        OB History    Gravida Para Term Preterm AB TAB SAB Ectopic Multiple Living   0                  Patient Active Problem List   Diagnosis Date Noted  . Fibroids 08/06/2013    Past Medical History  Diagnosis Date  . Psoriasis of scalp   . Eczema     arms and neck  . Dysmenorrhea   . Fibroid   . Heart murmur     dx'd as a teenager and told would outgrow  . Hormone disorder     enlarged thyroid  . STD (sexually transmitted disease)     Tx'd for Chlamydia and Gonorrhea age 11  . Seasonal allergies     Past Surgical History  Procedure Laterality Date  . Eye surgery      to remove styes at age 8  . Wisdom teeth Bilateral     Current Outpatient Prescriptions  Medication Sig Dispense Refill  . acetaminophen (TYLENOL) 500 MG tablet Take 500 mg by mouth every 6 (six) hours as needed for moderate pain.     . clobetasol ointment (TEMOVATE) 0.05 % as needed.    Marland Kitchen ibuprofen (ADVIL,MOTRIN) 800 MG tablet Take 1 tablet (800 mg total) by mouth 3 (three) times daily. (Patient not taking: Reported on 05/17/2016) 21 tablet 0   No current facility-administered medications for this visit.      ALLERGIES: Review of patient's allergies indicates no known allergies.  Family History  Problem Relation Age of Onset  . Hypertension Mother   . Thyroid disease Mother   . Diabetes Father   . Hypertension Father   . Diabetes Maternal Grandmother   . Hypertension Maternal Grandmother   . Stroke Maternal Grandmother   . Diabetes Maternal Grandfather   . Hypertension Maternal Grandfather   . Hypertension Paternal Grandmother   . Hypertension Paternal Grandfather     Social History   Social History  . Marital Status: Single    Spouse Name: N/A  . Number of Children: N/A  . Years of Education: N/A   Occupational History  . Not on file.   Social History Main Topics  . Smoking status: Never Smoker   . Smokeless tobacco: Never Used  . Alcohol Use: No  . Drug Use: No  . Sexual Activity: No   Other Topics Concern  . Not on file   Social History Narrative    ROS:  Pertinent items are noted in HPI.  PHYSICAL EXAMINATION:    BP 118/80 mmHg  Pulse 78  Temp(Src) 98.2 F (  36.8 C) (Oral)  Resp 16  Ht 5' 2.5" (1.588 m)  Wt 189 lb (85.73 kg)  BMI 34.00 kg/m2  LMP 05/07/2016    General appearance: alert, cooperative and appears stated age   Abdomen: mass to umbilicus plus 4 cm, nontender.    Pelvic: External genitalia:  no lesions              Urethra:  normal appearing urethra with no masses, tenderness or lesions              Bartholins and Skenes: normal                 Vagina: normal appearing vagina with normal color and discharge, no lesions              Cervix: no lesions.  Clear mucousy discharge from os.           Bimanual Exam:  Uterus:  enlarged, 24 weeks size              Adnexa: normal adnexa and no mass, fullness, tenderness                 Chaperone was present for exam.  ASSESSMENT  Large uterine fibroids.  Pelvic discomfort.  I think this is from her fibroids. Desire for STD testing.   PLAN  STD testing today - HIV, RPR, hep C, hep B,  GC/CT, Affirm.  Recommend condom use.  Complete consult with Dr. Robbie Louis regarding potential myomectomy.  Follow up here for annual exam and prn.     An After Visit Summary was printed and given to the patient.  _15_____ minutes face to face time of which over 50% was spent in counseling.

## 2016-05-18 LAB — STD PANEL
HEP B S AG: NEGATIVE
HIV 1&2 Ab, 4th Generation: NONREACTIVE

## 2016-05-18 LAB — WET PREP BY MOLECULAR PROBE
CANDIDA SPECIES: NEGATIVE
Gardnerella vaginalis: NEGATIVE
Trichomonas vaginosis: NEGATIVE

## 2016-05-18 LAB — HEPATITIS C ANTIBODY: HCV Ab: NEGATIVE

## 2016-05-20 LAB — GC/CHLAMYDIA PROBE AMP
CT Probe RNA: NOT DETECTED
GC Probe RNA: NOT DETECTED

## 2016-07-31 NOTE — Progress Notes (Deleted)
30 y.o. G0P0 Single African American female here for annual exam.    PCP:     No LMP recorded.           Sexually active: {yes no:314532}  The current method of family planning is {contraception:315051}.    Exercising: {yes no:314532}  {types:19826} Smoker:  no  Health Maintenance: Pap:  07-24-15 Negative History of abnormal Pap:  no MMG:  n/a Colonoscopy:  n/a BMD:   n/a  Result  n/a TDaP:  PCP Gardasil:   {YES NO:22349} HIV: Hep C: Screening Labs:  Hb today: ***, Urine today: ***   reports that she has never smoked. She has never used smokeless tobacco. She reports that she does not drink alcohol or use drugs.  Past Medical History:  Diagnosis Date  . Dysmenorrhea   . Eczema    arms and neck  . Fibroid   . Heart murmur    dx'd as a teenager and told would outgrow  . Hormone disorder    enlarged thyroid  . Psoriasis of scalp   . Seasonal allergies   . STD (sexually transmitted disease)    Tx'd for Chlamydia and Gonorrhea age 5    Past Surgical History:  Procedure Laterality Date  . EYE SURGERY     to remove styes at age 66  . wisdom teeth Bilateral     Current Outpatient Prescriptions  Medication Sig Dispense Refill  . acetaminophen (TYLENOL) 500 MG tablet Take 500 mg by mouth every 6 (six) hours as needed for moderate pain.     . clobetasol ointment (TEMOVATE) 0.05 % as needed.    Marland Kitchen ibuprofen (ADVIL,MOTRIN) 800 MG tablet Take 1 tablet (800 mg total) by mouth 3 (three) times daily. (Patient not taking: Reported on 05/17/2016) 21 tablet 0   No current facility-administered medications for this visit.     Family History  Problem Relation Age of Onset  . Hypertension Mother   . Thyroid disease Mother   . Diabetes Father   . Hypertension Father   . Diabetes Maternal Grandmother   . Hypertension Maternal Grandmother   . Stroke Maternal Grandmother   . Diabetes Maternal Grandfather   . Hypertension Maternal Grandfather   . Hypertension Paternal  Grandmother   . Hypertension Paternal Grandfather     ROS:  Pertinent items are noted in HPI.  Otherwise, a comprehensive ROS was negative.  Exam:   There were no vitals taken for this visit.    General appearance: alert, cooperative and appears stated age Head: Normocephalic, without obvious abnormality, atraumatic Neck: no adenopathy, supple, symmetrical, trachea midline and thyroid normal to inspection and palpation Lungs: clear to auscultation bilaterally Breasts: normal appearance, no masses or tenderness, No nipple retraction or dimpling, No nipple discharge or bleeding, No axillary or supraclavicular adenopathy Heart: regular rate and rhythm Abdomen: soft, non-tender; no masses, no organomegaly Extremities: extremities normal, atraumatic, no cyanosis or edema Skin: Skin color, texture, turgor normal. No rashes or lesions Lymph nodes: Cervical, supraclavicular, and axillary nodes normal. No abnormal inguinal nodes palpated Neurologic: Grossly normal  Pelvic: External genitalia:  no lesions              Urethra:  normal appearing urethra with no masses, tenderness or lesions              Bartholins and Skenes: normal                 Vagina: normal appearing vagina with normal color and discharge, no  lesions              Cervix: no lesions              Pap taken: {yes no:314532} Bimanual Exam:  Uterus:  normal size, contour, position, consistency, mobility, non-tender              Adnexa: no mass, fullness, tenderness              Rectal exam: {yes no:314532}.  Confirms.              Anus:  normal sphincter tone, no lesions  Chaperone was present for exam.  Assessment:   Well woman visit with normal exam.   Plan: Yearly mammogram recommended after age 48.  Recommended self breast exam.  Pap and HR HPV as above. Discussed Calcium, Vitamin D, regular exercise program including cardiovascular and weight bearing exercise.   Follow up annually and prn.   Additional  counseling given.  {yes [id number]. _______ minutes face to face time of which over 50% was spent in counseling.    After visit summary provided.

## 2016-08-01 ENCOUNTER — Ambulatory Visit: Payer: BC Managed Care – PPO | Admitting: Obstetrics and Gynecology

## 2016-08-05 ENCOUNTER — Encounter: Payer: Self-pay | Admitting: Obstetrics and Gynecology

## 2016-08-05 ENCOUNTER — Ambulatory Visit (INDEPENDENT_AMBULATORY_CARE_PROVIDER_SITE_OTHER): Payer: BC Managed Care – PPO | Admitting: Obstetrics and Gynecology

## 2016-08-05 VITALS — BP 110/76 | HR 84 | Resp 22 | Ht 62.25 in | Wt 190.4 lb

## 2016-08-05 DIAGNOSIS — Z01411 Encounter for gynecological examination (general) (routine) with abnormal findings: Secondary | ICD-10-CM | POA: Diagnosis not present

## 2016-08-05 DIAGNOSIS — Z0000 Encounter for general adult medical examination without abnormal findings: Secondary | ICD-10-CM | POA: Diagnosis not present

## 2016-08-05 DIAGNOSIS — N760 Acute vaginitis: Secondary | ICD-10-CM

## 2016-08-05 DIAGNOSIS — D259 Leiomyoma of uterus, unspecified: Secondary | ICD-10-CM

## 2016-08-05 LAB — POCT URINALYSIS DIPSTICK
BILIRUBIN UA: NEGATIVE
Blood, UA: NEGATIVE
Glucose, UA: NEGATIVE
KETONES UA: NEGATIVE
Nitrite, UA: NEGATIVE
PH UA: 5
Urobilinogen, UA: NEGATIVE

## 2016-08-05 LAB — COMPREHENSIVE METABOLIC PANEL
ALK PHOS: 43 U/L (ref 33–115)
ALT: 11 U/L (ref 6–29)
AST: 17 U/L (ref 10–30)
Albumin: 4 g/dL (ref 3.6–5.1)
BUN: 11 mg/dL (ref 7–25)
CALCIUM: 9 mg/dL (ref 8.6–10.2)
CHLORIDE: 104 mmol/L (ref 98–110)
CO2: 25 mmol/L (ref 20–31)
Creat: 0.79 mg/dL (ref 0.50–1.10)
GLUCOSE: 98 mg/dL (ref 65–99)
POTASSIUM: 4.4 mmol/L (ref 3.5–5.3)
Sodium: 137 mmol/L (ref 135–146)
Total Bilirubin: 0.5 mg/dL (ref 0.2–1.2)
Total Protein: 7 g/dL (ref 6.1–8.1)

## 2016-08-05 LAB — LIPID PANEL
CHOL/HDL RATIO: 3.7 ratio (ref <=5.0)
CHOLESTEROL: 161 mg/dL (ref 125–200)
HDL: 43 mg/dL — ABNORMAL LOW (ref >=46)
LDL Cholesterol: 102 mg/dL (ref <130)
Triglycerides: 80 mg/dL (ref <150)
VLDL: 16 mg/dL (ref <30)

## 2016-08-05 LAB — CBC
HEMATOCRIT: 36.8 % (ref 35.0–45.0)
Hemoglobin: 11.7 g/dL (ref 11.7–15.5)
MCH: 25.4 pg — AB (ref 27.0–33.0)
MCHC: 31.8 g/dL — ABNORMAL LOW (ref 32.0–36.0)
MCV: 80 fL (ref 80.0–100.0)
MPV: 9.6 fL (ref 7.5–12.5)
PLATELETS: 327 10*3/uL (ref 140–400)
RBC: 4.6 MIL/uL (ref 3.80–5.10)
RDW: 15.6 % — AB (ref 11.0–15.0)
WBC: 5 10*3/uL (ref 3.8–10.8)

## 2016-08-05 LAB — TSH: TSH: 0.89 mIU/L

## 2016-08-05 NOTE — Patient Instructions (Signed)

## 2016-08-05 NOTE — Progress Notes (Signed)
30 y.o. G0P0 Single African American female here for annual exam.    Would like routine labs.  Reporting abnormal discharge. Some odor.  Thinks it is BV.  When uses Latex condoms, she has itching.  May be interested in prolonged tx for BV.   Did STD testing in July 2017 which was all negative including Affirm and GC/CT. New partner since May.   Hx large fibroids.  All subserosal.  No pain.  Menses "heavy since I started."   Wears 2 pads or one long one and changes every 2 - 3 hours.  Takes iron pills once a day.   Would like to have a pregnancy.   Saw Dr. Kerin Perna.  Told to do observational therapy.   School moved to a new location.    PCP:  [facility]   No LMP recorded.     Period Cycle (Days): 30 Period Duration (Days): 6 Period Pattern: Regular Menstrual Flow:  (heavy first 2-3 days) Menstrual Control: Maxi pad Menstrual Control Change Freq (Hours): every 3 hours on heaviest day Dysmenorrhea: (!) Mild Dysmenorrhea Symptoms: Cramping, Headache (headaches during cycles)     Sexually active: Yes.   female The current method of family planning is none.    Exercising: No.   Smoker:  no  Health Maintenance: Pap: 07-24-15  Neg History of abnormal Pap:  no MMG:  N/A Colonoscopy:  n/a BMD:   n/a  Result  n/a TDaP:  PCP Gardasil:   yes   Screening Labs:  Hb today: 11.5, Urine today: 2+WBCs--asymptomatic   reports that she has never smoked. She has never used smokeless tobacco. She reports that she drinks alcohol. She reports that she does not use drugs.  Past Medical History:  Diagnosis Date  . Dysmenorrhea   . Eczema    arms and neck  . Fibroid   . Heart murmur    dx'd as a teenager and told would outgrow  . Hormone disorder    enlarged thyroid  . Psoriasis of scalp   . Seasonal allergies   . STD (sexually transmitted disease)    Tx'd for Chlamydia and Gonorrhea age 86    Past Surgical History:  Procedure Laterality Date  . EYE SURGERY      to remove styes at age 68  . wisdom teeth Bilateral     Current Outpatient Prescriptions  Medication Sig Dispense Refill  . acetaminophen (TYLENOL) 500 MG chewable tablet Chew 500 mg by mouth every 6 (six) hours as needed for pain.    . clobetasol ointment (TEMOVATE) AB-123456789 % Apply 1 application topically as needed.      No current facility-administered medications for this visit.     Family History  Problem Relation Age of Onset  . Hypertension Mother   . Thyroid disease Mother   . Diabetes Father   . Hypertension Father   . Diabetes Maternal Grandmother   . Hypertension Maternal Grandmother   . Stroke Maternal Grandmother   . Diabetes Maternal Grandfather   . Hypertension Maternal Grandfather   . Hypertension Paternal Grandmother   . Hypertension Paternal Grandfather     ROS:  Pertinent items are noted in HPI.  Otherwise, a comprehensive ROS was negative.  Exam:   BP 110/76 (BP Location: Right Arm, Patient Position: Sitting, Cuff Size: Normal)   Pulse 84   Resp (!) 22   Ht 5' 2.25" (1.581 m)   Wt 190 lb 6.4 oz (86.4 kg)   BMI 34.55 kg/m  General appearance: alert, cooperative and appears stated age Head: Normocephalic, without obvious abnormality, atraumatic Neck: no adenopathy, supple, symmetrical, trachea midline and thyroid normal to inspection and palpation Lungs: clear to auscultation bilaterally Breasts: normal appearance, no masses or tenderness, No nipple retraction or dimpling, No nipple discharge or bleeding, Bilateral nipple inversion (long standing), No axillary or supraclavicular adenopathy Heart: regular rate and rhythm Abdomen: soft, non-tender; no masses, 24 week size uterus.  Extremities: extremities normal, atraumatic, no cyanosis or edema Skin: Skin color, texture, turgor normal. No rashes or lesions Lymph nodes: Cervical, supraclavicular, and axillary nodes normal. No abnormal inguinal nodes palpated Neurologic: Grossly normal  Pelvic:  External genitalia:  no lesions              Urethra:  normal appearing urethra with no masses, tenderness or lesions              Bartholins and Skenes: normal                 Vagina: normal appearing vagina with normal color and discharge, no lesions              Cervix: no lesions              Pap taken: No. Bimanual Exam:  Uterus:   24 week size uterus.               Adnexa:  Ovaries not palpated separately from the uterus.              Chaperone was present for exam.  Assessment:   Well woman visit with normal exam. Fibroids. Large and subserosal.  Recurrent BV.   Plan: Yearly mammogram recommended after age 12.  Recommended self breast exam.  Pap and HR HPV as above. Discussed Calcium, Vitamin D, regular exercise program including cardiovascular and weight bearing exercise. Will do Affirm and then consider long term Metrogel.  Routine labs.  Will refer to RE&I in university setting.    Follow up annually and prn.      After visit summary provided.

## 2016-08-06 LAB — WET PREP BY MOLECULAR PROBE
CANDIDA SPECIES: NEGATIVE
Gardnerella vaginalis: POSITIVE — AB
Trichomonas vaginosis: NEGATIVE

## 2016-08-06 LAB — HEMOGLOBIN, FINGERSTICK: HEMOGLOBIN, FINGERSTICK: 11.5 g/dL — AB (ref 12.0–16.0)

## 2016-08-06 MED ORDER — METRONIDAZOLE 0.75 % VA GEL: 1.0000 | Freq: Every day | VAGINAL | 4 refills | Status: DC

## 2016-08-06 MED ORDER — METRONIDAZOLE 500 MG PO TABS: 500.0000 mg | ORAL_TABLET | Freq: Two times a day (BID) | ORAL | 0 refills | Status: DC

## 2016-08-06 NOTE — Addendum Note (Signed)
Addended by: Yisroel Ramming, BROOK E on: 08/06/2016 07:51 AM   Modules accepted: Orders

## 2016-08-12 ENCOUNTER — Ambulatory Visit (INDEPENDENT_AMBULATORY_CARE_PROVIDER_SITE_OTHER): Payer: BC Managed Care – PPO | Admitting: Obstetrics and Gynecology

## 2016-08-12 ENCOUNTER — Encounter: Payer: Self-pay | Admitting: Obstetrics and Gynecology

## 2016-08-12 ENCOUNTER — Telehealth: Payer: Self-pay | Admitting: Obstetrics and Gynecology

## 2016-08-12 VITALS — BP 116/74 | HR 70 | Temp 98.3°F | Resp 16 | Ht 62.25 in | Wt 189.0 lb

## 2016-08-12 DIAGNOSIS — N390 Urinary tract infection, site not specified: Secondary | ICD-10-CM | POA: Diagnosis not present

## 2016-08-12 DIAGNOSIS — R319 Hematuria, unspecified: Secondary | ICD-10-CM

## 2016-08-12 DIAGNOSIS — N926 Irregular menstruation, unspecified: Secondary | ICD-10-CM

## 2016-08-12 LAB — POCT URINALYSIS DIPSTICK
BILIRUBIN UA: NEGATIVE
GLUCOSE UA: NEGATIVE
Ketones, UA: NEGATIVE
NITRITE UA: NEGATIVE
Protein, UA: NEGATIVE
Urobilinogen, UA: NEGATIVE
pH, UA: 5

## 2016-08-12 LAB — POCT URINE PREGNANCY: Preg Test, Ur: NEGATIVE

## 2016-08-12 MED ORDER — SULFAMETHOXAZOLE-TRIMETHOPRIM 800-160 MG PO TABS: 1.0000 | ORAL_TABLET | Freq: Two times a day (BID) | ORAL | 0 refills | Status: DC

## 2016-08-12 NOTE — Telephone Encounter (Signed)
Patient has some questions for a nurse about the medication that she was prescribed recently.

## 2016-08-12 NOTE — Progress Notes (Addendum)
GYNECOLOGY  VISIT - OFFICE VISIT - DR. SILVA   HPI: 30 y.o.   Single  African American  female   G0P0 with Patient's last menstrual period was 07/17/2016 (exact date).   here for  UTI.  Burns with urination.  Low back ache.  Feels hot at night.  No nausea or vomiting.  No UTI since high school.   Urine dip today - 2+ WBCs, trace RBCs.  UPT today - neg.  Currently is on Flagyl 500 mg po bid for BV.  This was just diagnosed when she was here for her annual exam on 08/05/16.  She does have large uterine fibroids also.  GYNECOLOGIC HISTORY: Patient's last menstrual period was 07/17/2016 (exact date). Contraception:  none Menopausal hormone therapy:  n/a Last mammogram:  n/a Last pap smear:   07-14-15 neg     OB History    Gravida Para Term Preterm AB Living   0             SAB TAB Ectopic Multiple Live Births                     Patient Active Problem List   Diagnosis Date Noted  . Fibroids 08/06/2013    Past Medical History:  Diagnosis Date  . Dysmenorrhea   . Eczema    arms and neck  . Fibroid   . Heart murmur    dx'd as a teenager and told would outgrow  . Hormone disorder    enlarged thyroid  . Psoriasis of scalp   . Seasonal allergies   . STD (sexually transmitted disease)    Tx'd for Chlamydia and Gonorrhea age 35    Past Surgical History:  Procedure Laterality Date  . EYE SURGERY     to remove styes at age 61  . wisdom teeth Bilateral     Current Outpatient Prescriptions  Medication Sig Dispense Refill  . acetaminophen (TYLENOL) 500 MG chewable tablet Chew 500 mg by mouth every 6 (six) hours as needed for pain.    . clobetasol ointment (TEMOVATE) [id number] % Apply 1 application topically as needed.     . metroNIDAZOLE (FLAGYL) 500 MG tablet Take 1 tablet (500 mg total) by mouth 2 (two) times daily. 14 tablet 0  . metroNIDAZOLE (METROGEL) 0.75 % vaginal gel Place 1 Applicatorful vaginally at bedtime. Use twice a week for 4 months.  Begin after  completing Flagyl. 70 g 4   No current facility-administered medications for this visit.      ALLERGIES: Review of patient's allergies indicates no known allergies.  Family History  Problem Relation Age of Onset  . Hypertension Mother   . Thyroid disease Mother   . Diabetes Father   . Hypertension Father   . Diabetes Maternal Grandmother   . Hypertension Maternal Grandmother   . Stroke Maternal Grandmother   . Diabetes Maternal Grandfather   . Hypertension Maternal Grandfather   . Hypertension Paternal Grandmother   . Hypertension Paternal Grandfather     Social History   Social History  . Marital status: Single    Spouse name: N/A  . Number of children: N/A  . Years of education: N/A   Occupational History  . Not on file.   Social History Main Topics  . Smoking status: Never Smoker  . Smokeless tobacco: Never Used  . Alcohol use 0.0 oz/week     Comment: once every few months  . Drug use: No  . Sexual activity: Yes  Partners: Male    Birth control/ protection: None   Other Topics Concern  . Not on file   Social History Narrative  . No narrative on file    ROS:  Pertinent items are noted in HPI.  PHYSICAL EXAMINATION:    BP 116/74   Pulse 70   Temp 98.3 F (36.8 C) (Oral)   Resp 16   Ht 5' 2.25" (1.581 m)   Wt 189 lb (85.7 kg)   LMP 07/17/2016 (Exact Date)   BMI 34.29 kg/m     General appearance: alert, cooperative and appears stated age Head: Normocephalic, without obvious abnormality, atraumatic Neck: no adenopathy, supple, symmetrical, trachea midline and thyroid normal to inspection and palpation Lungs: clear to auscultation bilaterally Heart: regular rate and rhythm Abdomen: soft, non-tender, no masses, no organomegaly Back:  No CVA tenderness.  Pelvic: External genitalia:  no lesions                            Bimanual Exam:  Uterus:  24 week size.  Tender to palpation over the bladder area.  No CMT.            Chaperone was present  for exam.  ASSESSMENT  UTI.  Current BV under treatment.  Large uterine fibroids.   PLAN  Bactrim DS po bid for 3 days. AZO 100 mg po tid for 2 days prn.  UC sent. Will finish Flagyl.   An After Visit Summary was printed and given to the patient.  __15____ minutes face to face time of which over 50% was spent in counseling.

## 2016-08-12 NOTE — Patient Instructions (Signed)
Take AZO over the counter.  You may take one my mouth three times a day for bladder pain for 2 days.  The Bactrim DS you take by mouth twice a day for 3 days.

## 2016-08-12 NOTE — Telephone Encounter (Signed)
Returned call to patient. Patient states that she was prescribed flagyl for recurrent BV last week. Patient states she started the flagyl on 08/07/16, but her symptoms have not improved. She states she is still having yellow and white discharge, and has starting having burning with urination that she did not have before starting the flagyl. Patient states she is unsure if she has a temperature, as she does not have a thermometer to check, but she has been feeling very hot. RN offered office visit and patient agreeable to be seen for evaluation of symptoms. Office visit scheduled for 08/12/16 at 1515 with Dr. Quincy Simmonds. Patient agreeable to date and time and aware to arrive 15 minutes early for appointment.    Routing to provider for final review. Patient agreeable to disposition. Will close encounter.

## 2016-08-13 ENCOUNTER — Encounter: Payer: Self-pay | Admitting: Obstetrics and Gynecology

## 2016-08-13 LAB — URINE CULTURE: ORGANISM ID, BACTERIA: NO GROWTH

## 2016-08-13 NOTE — Progress Notes (Signed)
Patient was personally seen and examined by me for her office visit today.  Encounter by Dr. Aundria Rud.

## 2016-09-18 ENCOUNTER — Other Ambulatory Visit: Payer: Self-pay

## 2016-09-18 DIAGNOSIS — R102 Pelvic and perineal pain: Secondary | ICD-10-CM

## 2016-11-04 DIAGNOSIS — B009 Herpesviral infection, unspecified: Secondary | ICD-10-CM

## 2016-11-04 HISTORY — DX: Herpesviral infection, unspecified: B00.9

## 2017-01-03 ENCOUNTER — Encounter: Payer: Self-pay | Admitting: Obstetrics and Gynecology

## 2017-01-03 ENCOUNTER — Ambulatory Visit (INDEPENDENT_AMBULATORY_CARE_PROVIDER_SITE_OTHER): Payer: BC Managed Care – PPO | Admitting: Obstetrics and Gynecology

## 2017-01-03 ENCOUNTER — Telehealth: Payer: Self-pay | Admitting: Obstetrics and Gynecology

## 2017-01-03 VITALS — BP 128/88 | HR 88 | Resp 16 | Ht 62.25 in | Wt 193.0 lb

## 2017-01-03 DIAGNOSIS — N766 Ulceration of vulva: Secondary | ICD-10-CM | POA: Diagnosis not present

## 2017-01-03 DIAGNOSIS — Z113 Encounter for screening for infections with a predominantly sexual mode of transmission: Secondary | ICD-10-CM

## 2017-01-03 MED ORDER — LIDOCAINE 5 % EX OINT: 1.0000 "application " | TOPICAL_OINTMENT | Freq: Three times a day (TID) | CUTANEOUS | 0 refills | Status: DC

## 2017-01-03 MED ORDER — VALACYCLOVIR HCL 1 G PO TABS: 1000.0000 mg | ORAL_TABLET | Freq: Two times a day (BID) | ORAL | 0 refills | Status: DC

## 2017-01-03 NOTE — Telephone Encounter (Signed)
Spoke with patient. Patient states that she has a vaginal bump that she first noticed on Monday 12/30/2016. States that the bump has grown in size and is very painful. Reports the bump is pink/red in color. Denies fever or chills. Does not feel the mass is fluid filled. Requesting an appointment with Dr.Silva. Per review with Dr.Silva okay for patient to come today at 2 pm. Patient declines appointment stating she has to travel to the office after work and will not be able to make it until 3:15 pm. Appointment scheduled for 3:15 pm with Dr.Silva. Patient is aware this is a work in appointment.  Routing to provider for final review. Patient agreeable to disposition. Will close encounter.

## 2017-01-03 NOTE — Telephone Encounter (Signed)
Patient has a "vaginl bump" that has gotten bigger since Monday. Patient is asking to be seen today of possible.

## 2017-01-03 NOTE — Progress Notes (Signed)
GYNECOLOGY  VISIT   HPI: 31 y.o.   Single  African American  female   G0P0 with Patient's last menstrual period was 01/03/2017.   here for  Vaginal bumps.  Had symptom for one week.  States swelling has decreased but it is painful.  No fever or flu like symptoms.   No know history of HSV I or II.   New partner in September but not currently sexually active.   Hx of skin infection but not MRSA.  Considering myomectomy in July with [facility].  Has a 24 week size uterus.   GYNECOLOGIC HISTORY: Patient's last menstrual period was 01/03/2017. Contraception:  Abstinence  Menopausal hormone therapy:  n/a Last mammogram: n/a Last pap smear:   07/24/15 Neg         OB History    Gravida Para Term Preterm AB Living   0             SAB TAB Ectopic Multiple Live Births                     Patient Active Problem List   Diagnosis Date Noted  . Fibroids 08/06/2013    Past Medical History:  Diagnosis Date  . Dysmenorrhea   . Eczema    arms and neck  . Fibroid   . Heart murmur    dx'd as a teenager and told would outgrow  . Hormone disorder    enlarged thyroid  . Psoriasis of scalp   . Seasonal allergies   . STD (sexually transmitted disease)    Tx'd for Chlamydia and Gonorrhea age 46    Past Surgical History:  Procedure Laterality Date  . EYE SURGERY     to remove styes at age 89  . wisdom teeth Bilateral     Current Outpatient Prescriptions  Medication Sig Dispense Refill  . acetaminophen (TYLENOL) 500 MG chewable tablet Chew 500 mg by mouth every 6 (six) hours as needed for pain.    . clobetasol ointment (TEMOVATE) [id number] % Apply 1 application topically as needed.      No current facility-administered medications for this visit.      ALLERGIES: Pollen extract  Family History  Problem Relation Age of Onset  . Hypertension Mother   . Thyroid disease Mother   . Diabetes Father   . Hypertension Father   . Diabetes Maternal Grandmother   . Hypertension  Maternal Grandmother   . Stroke Maternal Grandmother   . Diabetes Maternal Grandfather   . Hypertension Maternal Grandfather   . Hypertension Paternal Grandmother   . Hypertension Paternal Grandfather     Social History   Social History  . Marital status: Single    Spouse name: N/A  . Number of children: N/A  . Years of education: N/A   Occupational History  . Not on file.   Social History Main Topics  . Smoking status: Never Smoker  . Smokeless tobacco: Never Used  . Alcohol use 0.0 oz/week     Comment: once every few months  . Drug use: No  . Sexual activity: Not Currently    Partners: Male    Birth control/ protection: None   Other Topics Concern  . Not on file   Social History Narrative  . No narrative on file    ROS:  Pertinent items are noted in HPI.  PHYSICAL EXAMINATION:    BP 128/88 (BP Location: Right Arm, Patient Position: Sitting, Cuff Size: Large)   Pulse 88  Resp 16   Ht 5' 2.25" (1.581 m)   Wt 193 lb (87.5 kg)   LMP 01/03/2017   BMI 35.02 kg/m     General appearance: alert, cooperative and appears stated age   Abdomen:  Mass to 4 cm above umbilicus.    Pelvic: External genitalia:   7 - 8 mm tender ulcer of the left labia majora.              Urethra:  normal appearing urethra with no masses, tenderness or lesions              Bartholins and Skenes: normal                 Vagina: normal appearing vagina with normal color and discharge, no lesions              Cervix: no lesions                Bimanual Exam:  Uterus:  24 week size somewhat mobile uterus.               Adnexa: no mass, fullness, tenderness             Chaperone was present for exam.  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   PLAN  Discussion of potential HSV.  HSV cx, wound cx and GC/CT performed.  If HSV is confirmed, she may need to return for full STD testing.  Lab is now closed.  Will give Rx for Valtrex and Lidocaine ointment.  Instructed in use.     An After Visit Summary was printed and given to the patient.  __25____ minutes face to face time of which over 50% was spent in counseling.

## 2017-01-06 LAB — WOUND CULTURE
GRAM STAIN: NONE SEEN
GRAM STAIN: NONE SEEN

## 2017-01-06 LAB — GC/CHLAMYDIA PROBE AMP
CT Probe RNA: NOT DETECTED
GC Probe RNA: NOT DETECTED

## 2017-01-06 LAB — HERPES SIMPLEX VIRUS CULTURE: Organism ID, Bacteria: DETECTED

## 2017-01-08 ENCOUNTER — Encounter: Payer: Self-pay | Admitting: Obstetrics and Gynecology

## 2017-01-08 ENCOUNTER — Other Ambulatory Visit: Payer: Self-pay | Admitting: *Deleted

## 2017-01-08 ENCOUNTER — Ambulatory Visit: Payer: BC Managed Care – PPO | Admitting: Obstetrics and Gynecology

## 2017-01-08 VITALS — BP 142/90 | HR 88 | Ht 62.25 in | Wt 193.0 lb

## 2017-01-08 DIAGNOSIS — N766 Ulceration of vulva: Secondary | ICD-10-CM

## 2017-01-08 DIAGNOSIS — Z113 Encounter for screening for infections with a predominantly sexual mode of transmission: Secondary | ICD-10-CM

## 2017-01-08 DIAGNOSIS — B009 Herpesviral infection, unspecified: Secondary | ICD-10-CM | POA: Diagnosis not present

## 2017-01-08 MED ORDER — AMPICILLIN 500 MG PO CAPS: 500.0000 mg | ORAL_CAPSULE | Freq: Three times a day (TID) | ORAL | 0 refills | Status: DC

## 2017-01-08 MED ORDER — VALACYCLOVIR HCL 500 MG PO TABS: 500.0000 mg | ORAL_TABLET | Freq: Every day | ORAL | 6 refills | Status: DC

## 2017-01-08 NOTE — Addendum Note (Signed)
Addended by: Yisroel Ramming, Dietrich Pates E on: 01/08/2017 04:58 AM   Modules accepted: Orders

## 2017-01-08 NOTE — Progress Notes (Signed)
GYNECOLOGY  VISIT   HPI: 31 y.o.   Single  African American  female   G0P0 with Patient's last menstrual period was 01/03/2017.  here to discuss lab results.    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 Not able to subtype.  Wound culture of the vulvar ulcer also showed GBS and just received Rx for Ampicillin for this superinfection. Had negative GC/CT at same visit.  Source of infection was her only partner who she is no longer with.   States she has had bumps on her bottom in the past.   Wants to see a counselor about stress and anxiety. Is a Education officer, museum.   GYNECOLOGIC HISTORY: Patient's last menstrual period was 01/03/2017. Contraception: Abstinence Menopausal hormone therapy:  n/a Last mammogram:  n/a Last pap smear: 07-24-15 Neg          OB History    Gravida Para Term Preterm AB Living   0             SAB TAB Ectopic Multiple Live Births                     Patient Active Problem List   Diagnosis Date Noted  . Fibroids 08/06/2013    Past Medical History:  Diagnosis Date  . Dysmenorrhea   . Eczema    arms and neck  . Fibroid   . Heart murmur    dx'd as a teenager and told would outgrow  . Herpes infection 2018  . Hormone disorder    enlarged thyroid  . Psoriasis of scalp   . Seasonal allergies   . STD (sexually transmitted disease)    Tx'd for Chlamydia and Gonorrhea age 89    Past Surgical History:  Procedure Laterality Date  . EYE SURGERY     to remove styes at age 43  . wisdom teeth Bilateral     Current Outpatient Prescriptions  Medication Sig Dispense Refill  . acetaminophen (TYLENOL) 500 MG chewable tablet Chew 500 mg by mouth every 6 (six) hours as needed for pain.    Marland Kitchen ampicillin (PRINCIPEN) 500 MG capsule Take 1 capsule (500 mg total) by mouth 3 (three) times daily. 21 capsule 0  . lidocaine (XYLOCAINE) 5 % ointment Apply 1 application topically 3 (three) times daily. 1.25 g 0  . valACYclovir (VALTREX) 1000 MG tablet Take 1  tablet (1,000 mg total) by mouth 2 (two) times daily. Take for 10 days 20 tablet 0  . clobetasol ointment (TEMOVATE) 2.83 % Apply 1 application topically as needed.      No current facility-administered medications for this visit.      ALLERGIES: Pollen extract  Family History  Problem Relation Age of Onset  . Hypertension Mother   . Thyroid disease Mother   . Diabetes Father   . Hypertension Father   . Diabetes Maternal Grandmother   . Hypertension Maternal Grandmother   . Stroke Maternal Grandmother   . Diabetes Maternal Grandfather   . Hypertension Maternal Grandfather   . Hypertension Paternal Grandmother   . Hypertension Paternal Grandfather     Social History   Social History  . Marital status: Single    Spouse name: N/A  . Number of children: N/A  . Years of education: N/A   Occupational History  . Not on file.   Social History Main Topics  . Smoking status: Never Smoker  . Smokeless tobacco: Never Used  . Alcohol use 0.0 oz/week  Comment: once every few months  . Drug use: No  . Sexual activity: Not Currently    Partners: Male    Birth control/ protection: None   Other Topics Concern  . Not on file   Social History Narrative  . No narrative on file    ROS:  Pertinent items are noted in HPI.  PHYSICAL EXAMINATION:    BP (!) 142/90 (BP Location: Right Arm, Patient Position: Sitting, Cuff Size: Normal)   Pulse 88   Ht 5' 2.25" (1.581 m)   Wt 193 lb (87.5 kg)   LMP 01/03/2017   BMI 35.02 kg/m     General appearance: alert, cooperative and appears stated age   ASSESSMENT  HSV infection.   I suspect this is not a primary infection but we are treating it at such as we have no prior dx. GBC superinfection.   PLAN  Comprehensive discussion of HSV, types I and II.  Will so sero subtyping today and add HIV, RPR, hep B and C.  Will start Valtrex 500 mg daily after completes 10 day course of Valtrex. Complete Ampicillin course.  Brochure for  [employer] counseling.  Patient may decide between this and counselors provided through her work.  She declines SSRI today.  Return for annual exam and prn.    An After Visit Summary was printed and given to the patient.  _25_____ minutes face to face time of which over 50% was spent in counseling.

## 2017-01-08 NOTE — Patient Instructions (Signed)
Genital Herpes Genital herpes is a common sexually transmitted infection (STI) that is caused by a virus. The virus spreads from person to person through sexual contact. Infection can cause itching, blisters, and sores around the genitals or rectum. Symptoms may last several days and then go away This is called an outbreak. However, the virus remains in your body, so you may have more outbreaks in the future. The time between outbreaks varies and can be months or years. Genital herpes affects men and women. It is particularly concerning for pregnant women because the virus can be passed to the baby during delivery and can cause serious problems. Genital herpes is also a concern for people who have a weak disease-fighting (immune) system. What are the causes? This condition is caused by the herpes simplex virus (HSV) type 1 or type 2. The virus may spread through:  Sexual contact with an infected person, including vaginal, anal, and oral sex.  Contact with fluid from a herpes sore.  The skin. This means that you can get herpes from an infected partner even if he or she does not have a visible sore or does not know that he or she is infected. What increases the risk? You are more likely to develop this condition if:  You have sex with many partners.  You do not use latex condoms during sex. What are the signs or symptoms? Most people do not have symptoms (asymptomatic) or have mild symptoms that may be mistaken for other skin problems. Symptoms may include:  Small red bumps near the genitals, rectum, or mouth. These bumps turn into blisters and then turn into sores.  Flu-like symptoms, including:  Fever.  Body aches.  Swollen lymph nodes.  Headache.  Painful urination.  Pain and itching in the genital area or rectal area.  Vaginal discharge.  Tingling or shooting pain in the legs and buttocks. Generally, symptoms are more severe and last longer during the first (primary)  outbreak. Flu-like symptoms are also more common during the primary outbreak. How is this diagnosed? Genital herpes may be diagnosed based on:  A physical exam.  Your medical history.  Blood tests.  A test of a fluid sample (culture) from an open sore. How is this treated? There is no cure for this condition, but treatment with antiviral medicines that are taken by mouth (orally) can do the following:  Speed up healing and relieve symptoms.  Help to reduce the spread of the virus to sexual partners.  Limit the chance of future outbreaks, or make future outbreaks shorter.  Lessen symptoms of future outbreaks. Your health care provider may also recommend pain relief medicines, such as aspirin or ibuprofen. Follow these instructions at home: Sexual activity   Do not have sexual contact during active outbreaks.  Practice safe sex. Latex condoms and female condoms may help prevent the spread of the herpes virus. General instructions   Keep the affected areas dry and clean.  Take over-the-counter and prescription medicines only as told by your health care provider.  Avoid rubbing or touching blisters and sores. If you do touch blisters or sores:  Wash your hands thoroughly with soap and water.  Do not touch your eyes afterward.  To help relieve pain or itching, you may take the following actions as directed by your health care provider:  Apply a cold, wet cloth (cold compress) to affected areas 4-6 times a day.  Apply a substance that protects your skin and reduces bleeding (astringent).  Apply a   gel that helps relieve pain around sores (lidocaine gel).  Take a warm, shallow bath that cleans the genital area (sitz bath).  Keep all follow-up visits as told by your health care provider. This is important. How is this prevented?  Use condoms. Although anyone can get genital herpes during sexual contact, even with the use of a condom, a condom can provide some  protection.  Avoid having multiple sexual partners.  Talk with your sexual partner about any symptoms either of you may have. Also, talk with your partner about any history of STIs.  Get tested for STIs before you have sex. Ask your partner to do the same.  Do not have sexual contact if you have symptoms of genital herpes. Contact a health care provider if:  Your symptoms are not improving with medicine.  Your symptoms return.  You have new symptoms.  You have a fever.  You have abdominal pain.  You have redness, swelling, or pain in your eye.  You notice new sores on other parts of your body.  You are a woman and experience bleeding between menstrual periods.  You have had herpes and you become pregnant or plan to become pregnant. Summary  Genital herpes is a common sexually transmitted infection (STI) that is caused by the herpes simplex virus (HSV) type 1 or type 2.  These viruses are most often spread through sexual contact with an infected person.  You are more likely to develop this condition if you have sex with many partners or you have unprotected sex.  Most people do not have symptoms (asymptomatic) or have mild symptoms that may be mistaken for other skin problems. Symptoms occur as outbreaks that may happen months or years apart.  There is no cure for this condition, but treatment with oral antiviral medicines can reduce symptoms, reduce the chance of spreading the virus to a partner, prevent future outbreaks, or shorten future outbreaks. This information is not intended to replace advice given to you by your health care provider. Make sure you discuss any questions you have with your health care provider. Document Released: 10/18/2000 Document Revised: 09/20/2016 Document Reviewed: 09/20/2016 Elsevier Interactive Patient Education  2017 Elsevier Inc.  

## 2017-01-09 ENCOUNTER — Encounter: Payer: Self-pay | Admitting: Obstetrics and Gynecology

## 2017-01-09 LAB — HEPATITIS C ANTIBODY: HCV AB: NEGATIVE

## 2017-01-09 LAB — STD PANEL
HIV 1&2 Ab, 4th Generation: NONREACTIVE
Hepatitis B Surface Ag: NEGATIVE

## 2017-01-09 LAB — HSV(HERPES SIMPLEX VRS) I + II AB-IGG: HSV 2 Glycoprotein G Ab, IgG: 8.67 Index — ABNORMAL HIGH (ref <0.90)

## 2017-01-10 ENCOUNTER — Telehealth: Payer: Self-pay | Admitting: Obstetrics and Gynecology

## 2017-01-10 ENCOUNTER — Encounter: Payer: Self-pay | Admitting: Obstetrics and Gynecology

## 2017-01-10 NOTE — Telephone Encounter (Signed)
Return call to patient. Left message to call back. 

## 2017-01-10 NOTE — Telephone Encounter (Signed)
Patient is having "a reaction to her Valtrex medication". Patient is asking to talk with a nurse.

## 2017-01-10 NOTE — Telephone Encounter (Signed)
Spoke with patient, advised of recommendations and results as seen below per Dr. Quincy Simmonds. Patient states she will return call next week with update. Patient verbalizes understanding and is agreeable.  Allergies updated to reflect Ampicillin and Valtrex - rash and itching. Current medications discontinued as recommended with allergy update.   Routing to provider for final review. Patient is agreeable to disposition. Will close encounter.

## 2017-01-10 NOTE — Telephone Encounter (Signed)
Patient has some questions regarding her test results.

## 2017-01-10 NOTE — Telephone Encounter (Signed)
Spoke with patient. Patient states she started Valtrex 1000 mg 2 times a day and ampicillin on 3/7. Patient states she had taken the Valtrex only once prior to starting on 3/7 with no reactions. Patient states she now has a itching on arms, chest and mouth that started 3/8. Patient denies any redness, swelling or SHOB. Advised patient to stop ampicillin, will review with provider and return call with recommendations. Patient also asking if lab results available for 3/8 OV. Advised patient will review with Dr. Quincy Simmonds and return call. Patient verbalizes understanding and is agreeable.    Dr. Quincy Simmonds, please advise on medication and results.

## 2017-01-10 NOTE — Telephone Encounter (Signed)
I would recommend stopping Valtrex and the Ampicillin as it is not possible to know which has caused the allergic reaction.  Please list both of these on her allergy list.  Take Benadryl 50 mg po q 6 hours prn rash and itching.  May cause sleepiness.  I would not recommend taking any antiviral or antibiotic at this time until the rash clears.  She can do warm soaks with soap and water for her bottom just for comfort and cleansing.  OK for Lidocaine as already prescribed.  Return to office for reassessment if the lump (lesion) does not improve.   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  The final testing is pending for HSV I. Testing is negative for HIV, syphilis, hepatitis B and C.   I will send this through separately as a result note.

## 2017-01-11 IMAGING — CT CT ABD-PELV W/ CM
2 of 4 series · 16 of 46 positions shown, 18 images · IV contrast (omnipaque)
Comparison: None.

CLINICAL DATA: Abdominal pain, onset last night. Progressively
worsening today.

EXAM:
CT ABDOMEN AND PELVIS WITH CONTRAST
TECHNIQUE: Multidetector CT imaging of the abdomen and pelvis was performed
using the standard protocol following bolus administration of
intravenous contrast.
CONTRAST:  50mL OMNIPAQUE IOHEXOL 300 MG/ML SOLN, 100mL OMNIPAQUE
IOHEXOL 300 MG/ML SOLN

[Series 2: abd/pel with · axial · 0.77mm/px · z∈[-440,-40]mm · 13 of 88 slices shown, 15 images]
[im 4/88  soft-tissue]
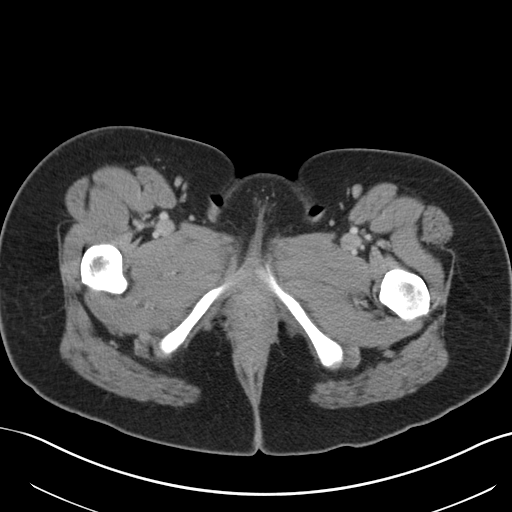
[im 4/88  bone]
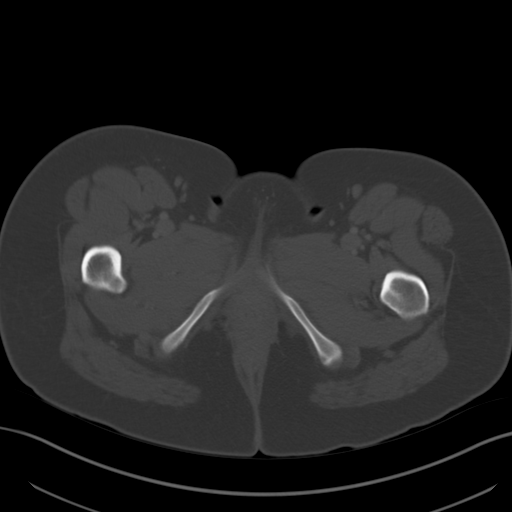
[im 12/88  soft-tissue]
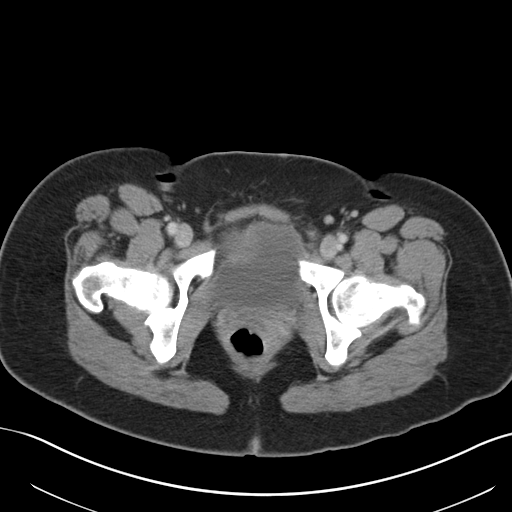
[im 19/88  soft-tissue]
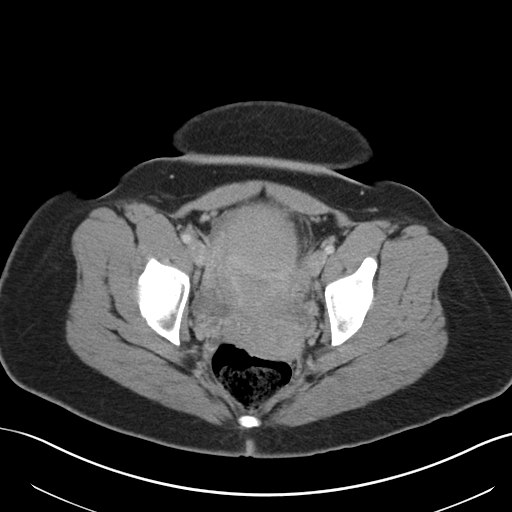
[im 23/88  soft-tissue]
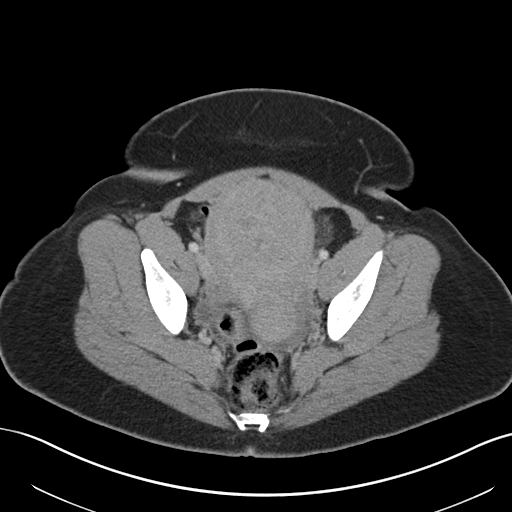
[im 31/88  soft-tissue]
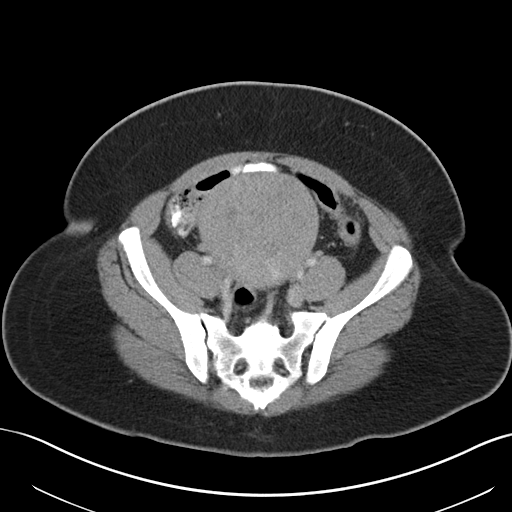
[im 38/88  soft-tissue]
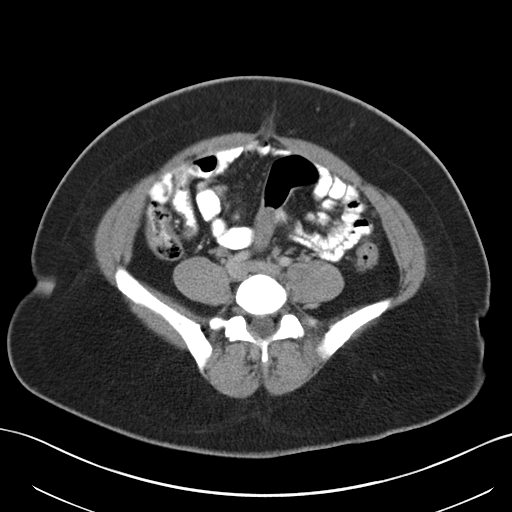
[im 46/88  soft-tissue]
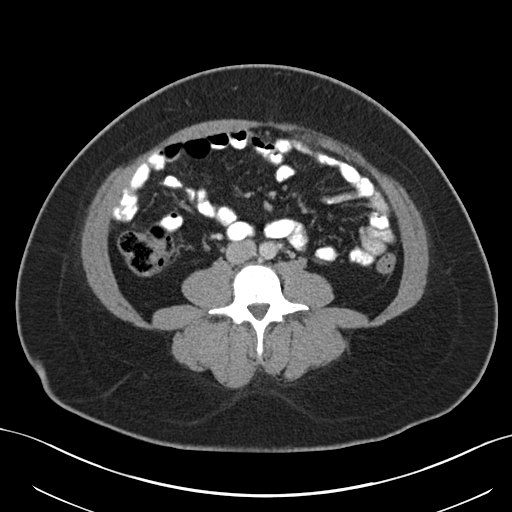
[im 50/88  soft-tissue]
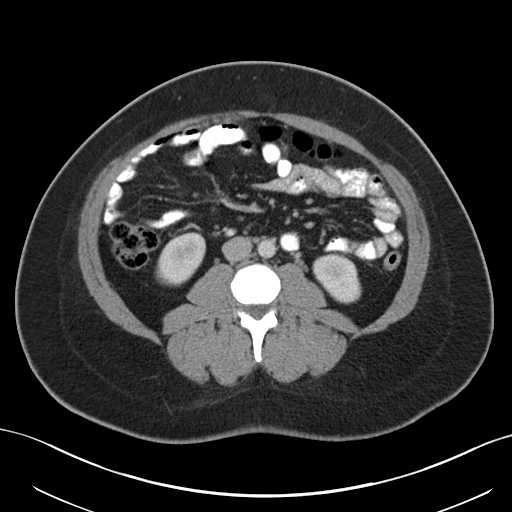
[im 57/88  soft-tissue]
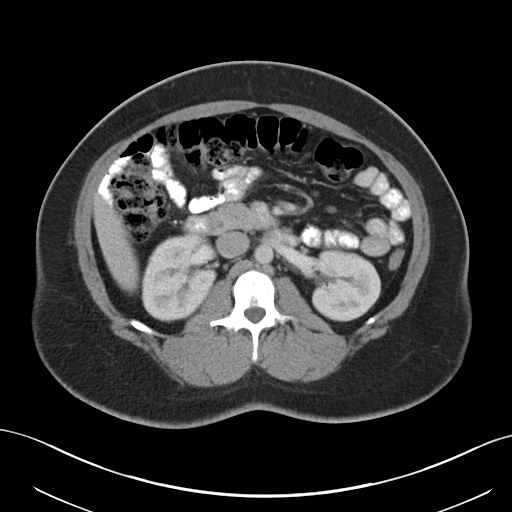
[im 57/88  bone]
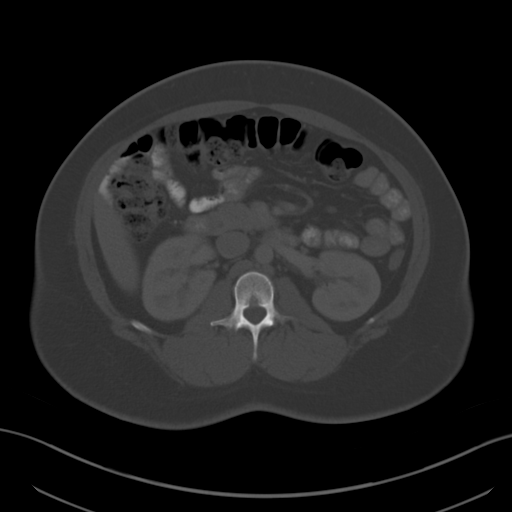
[im 65/88  soft-tissue]
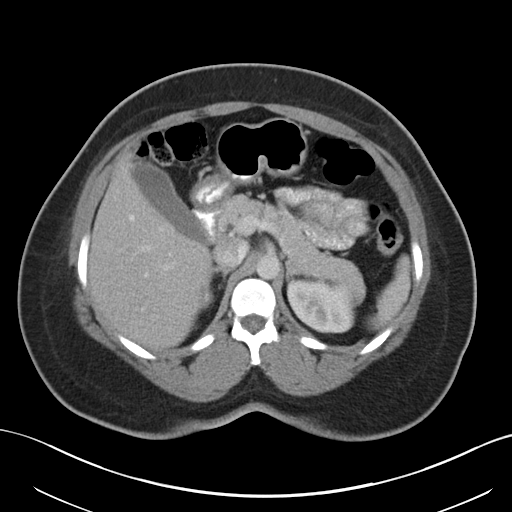
[im 69/88  soft-tissue]
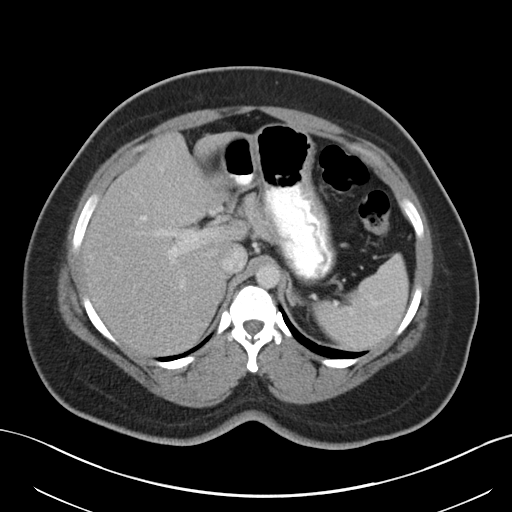
[im 76/88  soft-tissue]
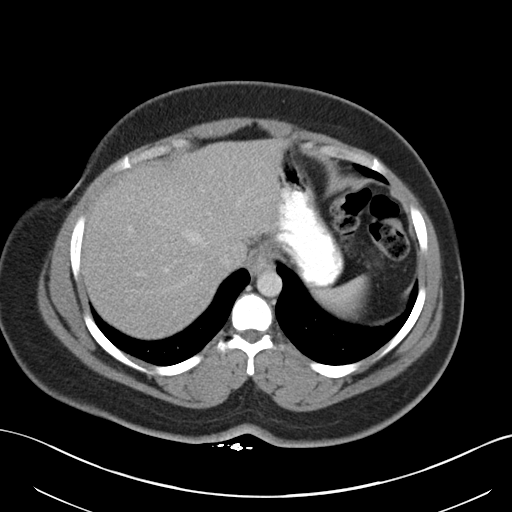
[im 84/88  soft-tissue]
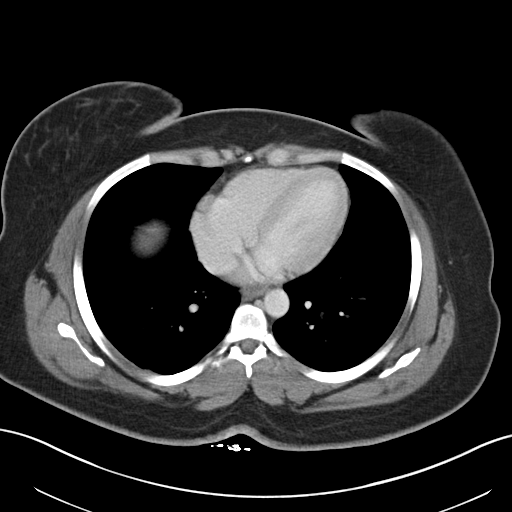

[Series 5: coronal a/|p · coronal · 0.67mm/px · 3 of 100 slices shown]
[im 34/100  soft-tissue]
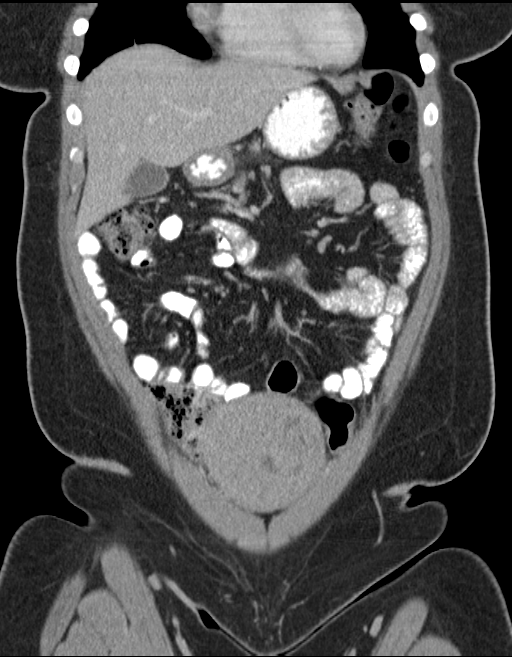
[im 45/100  soft-tissue]
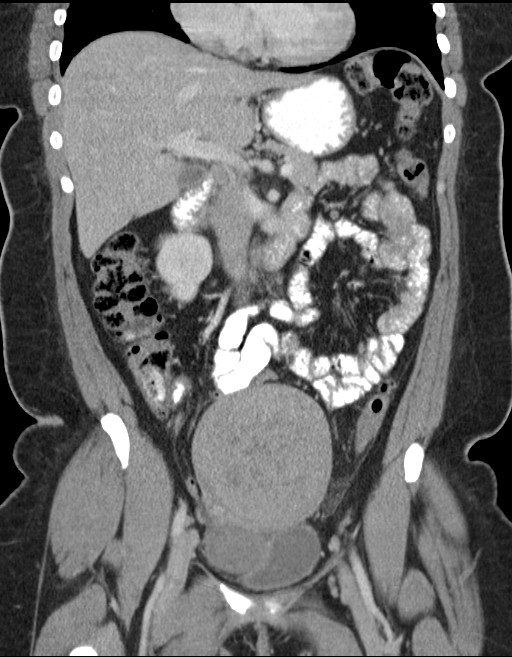
[im 56/100  soft-tissue]
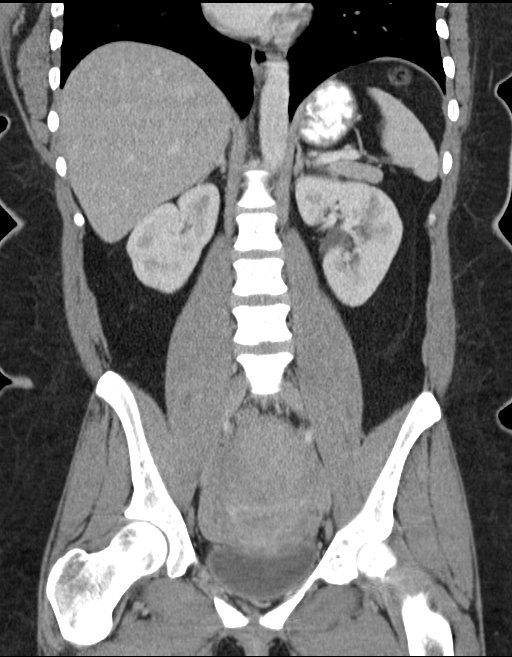

[16 of 46 positions shown; findings below may reference images not displayed]

FINDINGS: Lower chest:  No significant abnormality

Hepatobiliary: There are normal appearances of the liver,
gallbladder and bile ducts.

Pancreas: Normal

Spleen: Normal

Adrenals/Urinary Tract: The adrenals and kidneys are normal in
appearance. There is no urinary calculus evident. There is no
hydronephrosis or ureteral dilatation. Collecting systems and
ureters appear unremarkable.

Stomach/Bowel: There are normal appearances of the stomach, small
bowel and colon. The appendix is normal.

Vascular/Lymphatic: The abdominal aorta is normal in caliber. There
is no atherosclerotic calcification. There is no adenopathy in the
abdomen or pelvis.

Reproductive: There are multiple uterine fibroids, the largest
measuring 9.7 cm. The uterus is markedly enlarged. No adnexal
abnormalities are evident.

Other: There is no acute inflammatory change in the abdomen or
pelvis. There is no ascites.

Musculoskeletal: There is a small umbilical hernia containing fat
and a knuckle of unobstructed small bowel. No significant skeletal
lesions.
IMPRESSION: 1. Markedly enlarged multi fibroid uterus. The largest fibroid
measures 9.7 cm.
2. There is a knuckle of unobstructed small bowel protruding into a
small umbilical hernia.
3. No acute findings are evident in the abdomen or pelvis.

## 2017-01-12 LAB — HSV 1/2 AB (IGM), IFA W/RFLX TITER
HSV 1 IGM SCREEN: NEGATIVE
HSV 2 IGM SCREEN: NEGATIVE

## 2017-01-13 ENCOUNTER — Encounter: Payer: Self-pay | Admitting: Obstetrics and Gynecology

## 2017-01-15 NOTE — Telephone Encounter (Signed)
Dr. Quincy Simmonds, ok to close encounter? Result note dated 01/13/17 viewed by patient on 01/13/17 at 4:42pm.

## 2017-01-15 NOTE — Telephone Encounter (Signed)
I closed the encounter.  She has her final HSV results released by me through My Chart along with a message from me.

## 2017-01-20 ENCOUNTER — Telehealth: Payer: Self-pay | Admitting: Obstetrics and Gynecology

## 2017-01-20 NOTE — Telephone Encounter (Signed)
Patient calling to follow up on a medication reaction she had about a week and a half ago.  Wants to give an update.

## 2017-01-20 NOTE — Telephone Encounter (Signed)
Options are to take a different antiviral medication, Acyclovir 400 mg po bid for suppression or Acyclovir 400 mg po tid x 5 days prn an outbreak. She may want to monitor the frequency of outbreaks and then decide how she would like to proceed forward with any treatment.  Using daily antiviral medicaiton can be helpful if she is in a relationship as it will reduce the amount of shedding of viral particles her body does even when she is not having an outbreak. It is a matter of her personal choice for her.

## 2017-01-20 NOTE — Telephone Encounter (Signed)
Spoke with patient. Patient states she is calling to give update after stopping Valtrex and Ampicillin. See telephone encounter dated 01/10/17. Patient reports itching resolved. Patient states vaginal lesions went away, no discomfort. Patient asking if she should restart Valtrex or does Dr. Quincy Simmonds have other recommendations? Advised patient would update Dr. Quincy Simmonds and return call with recommendations, patient is agreeable.  Dr. Quincy Simmonds, please advise?

## 2017-01-21 MED ORDER — ACYCLOVIR 400 MG PO TABS: ORAL_TABLET | ORAL | 3 refills | Status: DC

## 2017-01-21 NOTE — Telephone Encounter (Signed)
Left message to call Deanna Boehlke at [phone]. 

## 2017-01-21 NOTE — Telephone Encounter (Signed)
Spoke with patient, advised as seen below per Dr. Quincy Simmonds. Patient states she will start by taking with outbreaks and monitor frequency of outbreaks. Patient asking about side effects of medication, reviewed side effects listed on [email or web address] -signs of allergic reaction and common side effects. Advised patient would review with Dr. Quincy Simmonds and return call with any additional recommendations.Patient verbalizes understanding and is agreeable.  Order placed for acyclovir 400 mg #30/3RF  Routing to provider for final review. Patient is agreeable to disposition. Will close encounter.

## 2017-01-27 ENCOUNTER — Ambulatory Visit (INDEPENDENT_AMBULATORY_CARE_PROVIDER_SITE_OTHER): Payer: BC Managed Care – PPO | Admitting: Licensed Clinical Social Worker

## 2017-01-27 DIAGNOSIS — F419 Anxiety disorder, unspecified: Secondary | ICD-10-CM

## 2017-02-13 ENCOUNTER — Ambulatory Visit: Payer: BC Managed Care – PPO | Admitting: Licensed Clinical Social Worker

## 2017-02-17 ENCOUNTER — Ambulatory Visit: Payer: BC Managed Care – PPO | Admitting: Licensed Clinical Social Worker

## 2017-03-11 ENCOUNTER — Ambulatory Visit: Payer: Self-pay | Admitting: Licensed Clinical Social Worker

## 2017-04-01 ENCOUNTER — Telehealth: Payer: Self-pay | Admitting: Obstetrics and Gynecology

## 2017-04-01 NOTE — Telephone Encounter (Signed)
error 

## 2017-04-11 ENCOUNTER — Other Ambulatory Visit: Payer: Self-pay | Admitting: Obstetrics and Gynecology

## 2017-04-11 ENCOUNTER — Telehealth: Payer: Self-pay | Admitting: Obstetrics and Gynecology

## 2017-04-11 MED ORDER — ACYCLOVIR 400 MG PO TABS: ORAL_TABLET | ORAL | 4 refills | Status: DC

## 2017-04-11 NOTE — Telephone Encounter (Signed)
Return call to pharmacy. Calling to confirm instructions for Acyclovir for daily or BID. Advised it for daily therapy and increase to BID as needed for outbreaks. Pharmacist confirmed.  Call to patient. Advised of advice from Dr Quincy Simmonds.   Encounter closed.

## 2017-04-11 NOTE — Telephone Encounter (Signed)
Pharmacy has questions about the directions.

## 2017-04-11 NOTE — Telephone Encounter (Signed)
Patient requesting acyclovir refill she is asking to go to twice a day.

## 2017-04-11 NOTE — Telephone Encounter (Signed)
Allison Bentley, [place]    04/11/17 10:25 AM  Note    Medication refill request: Acyclovir Last AEX:  08/05/16 BS Next AEX: 08/08/17 BS Last MMG (if hormonal medication request): n/a Refill authorized: 01/21/17 #30 3R.   ~ Patient is requesting to go to 2 times a day.   Please advise. Thank you.          04/11/17 10:13 AM  [place], Jackqulyn Livings routed this conversation to [place] W      04/11/17 10:11 AM  Note    Patient requesting acyclovir refill she is asking to go to twice a day.       Routing to Dr.Silva for review and advise of directions. Directions sent on rx are listed below.  Sig: Take 1 tablet (400 mg total) by mouth 2 times a day for infection prevention. Take 2 tablets (800 mg) by mouth twice a day for outbreaks.

## 2017-04-11 NOTE — Telephone Encounter (Signed)
I sent through the Acyclovir Rx with the usual recommended dosing for prophylaxis and for treating an acute infection if someone wants to do BID dosing.  If she wishes to take the Acyclovir 400 mg po bid for 5 days for an acute outbreak, that is OK.  I am just not sure if she will get the response desired.  I hope this helps.

## 2017-04-11 NOTE — Telephone Encounter (Signed)
Medication refill request: Acyclovir Last AEX:  08/05/16 BS Next AEX: 08/08/17 BS Last MMG (if hormonal medication request): n/a Refill authorized: 01/21/17 #30 3R.   ~ Patient is requesting to go to 2 times a day.   Please advise. Thank you.

## 2017-04-14 ENCOUNTER — Other Ambulatory Visit: Payer: Self-pay | Admitting: Obstetrics and Gynecology

## 2017-04-14 ENCOUNTER — Telehealth: Payer: Self-pay | Admitting: Obstetrics and Gynecology

## 2017-04-14 ENCOUNTER — Ambulatory Visit (INDEPENDENT_AMBULATORY_CARE_PROVIDER_SITE_OTHER): Payer: BC Managed Care – PPO | Admitting: Licensed Clinical Social Worker

## 2017-04-14 DIAGNOSIS — F419 Anxiety disorder, unspecified: Secondary | ICD-10-CM

## 2017-04-14 MED ORDER — FAMCICLOVIR 250 MG PO TABS: 250.0000 mg | ORAL_TABLET | Freq: Two times a day (BID) | ORAL | 0 refills | Status: DC

## 2017-04-14 NOTE — Telephone Encounter (Signed)
Returned patients call. Patient is calling concerning the Acyclovir. Patient states she thinks she may be having a reaction to the Acyclovir, over the weekend she started having tingling and numbness in hands, legs and feet. She also felt very jittery. She stopped the medication and felt much better yesterday. She is requesting something else to help prevent outbreaks. Advised patient that Dr. Quincy Simmonds is in surgery this morning and it would likely be after lunch before someone will call back. Patient voiced understanding.   Forwarding message to Dr. Quincy Simmonds

## 2017-04-14 NOTE — Telephone Encounter (Signed)
Ok to try Famciclovir 250 mg po bid for prevention.  However, with all of the reactions patient is having to the antivirals, I would recommend she take the Famciclovir just as needed when an outbreak occurs. The dosage for treating an outbreak is Famciclovir 500 mg po x 1, then 250 mg po bid for 2 days.  OK to dispense 60, RF one.

## 2017-04-14 NOTE — Telephone Encounter (Signed)
Spoke with patient and gave recommendations per Dr. Quincy Simmonds. Sent in RX for Famciclovir to [facility]

## 2017-04-14 NOTE — Telephone Encounter (Signed)
Patient has some questions about her birth control.

## 2017-05-09 ENCOUNTER — Other Ambulatory Visit: Payer: Self-pay | Admitting: Obstetrics and Gynecology

## 2017-05-09 NOTE — Telephone Encounter (Signed)
Medication refill request: famciclovir 250mg  Last AEX:  08-05-16 Next AEX: 08-08-17 Last MMG (if hormonal medication request): none Refill authorized: rx was refilled for 60 with 0 refills on 04-14-17. Please approve if its okay for patient to have more.

## 2017-05-12 ENCOUNTER — Telehealth: Payer: Self-pay | Admitting: Obstetrics and Gynecology

## 2017-05-12 NOTE — Telephone Encounter (Signed)
Patient is asking to talk to Dr.Silva's nurse about her prescription. Patient has some questions.

## 2017-05-12 NOTE — Telephone Encounter (Signed)
Spoke with patient. Patient states she started taking famciclovir bid, was not aware of additional instructions to take when needed with outbreak until looking at instructions on RX. Reviewed dosing instructions and recommendations per Dr. Quincy Simmonds in telephone encounter dated 04/14/17. Patient denies any current reactions, but is unsure. Reports she had itching around mouth, is concerned may be HSV 1, recent testing negative. Reports no intercourse or oral sex. Has surgery coming up through [place], is on medication to stop menses. Patient states she is unsure if taking with outbreak is enough and still learning what an outbreak looks like. Has bump currently, unsure if HSV or ingrown hair. Patient has additional questions about HSV1 vs. 2. Recommended OV for further discussion of HSV and medication. Patient scheduled for OV on 05/14/17 at 8:15am with Dr. Quincy Simmonds. Advised patient Dr. Quincy Simmonds would review, will return call with any additional recommendations. Patient is agreeable.   Routing to provider for final review. Patient is agreeable to disposition. Will close encounter.

## 2017-05-14 ENCOUNTER — Ambulatory Visit (INDEPENDENT_AMBULATORY_CARE_PROVIDER_SITE_OTHER): Payer: BC Managed Care – PPO | Admitting: Obstetrics and Gynecology

## 2017-05-14 ENCOUNTER — Encounter: Payer: Self-pay | Admitting: Obstetrics and Gynecology

## 2017-05-14 VITALS — BP 116/70 | HR 88 | Resp 16 | Wt 188.0 lb

## 2017-05-14 DIAGNOSIS — F439 Reaction to severe stress, unspecified: Secondary | ICD-10-CM

## 2017-05-14 DIAGNOSIS — B009 Herpesviral infection, unspecified: Secondary | ICD-10-CM | POA: Diagnosis not present

## 2017-05-14 DIAGNOSIS — L299 Pruritus, unspecified: Secondary | ICD-10-CM | POA: Diagnosis not present

## 2017-05-14 MED ORDER — FAMCICLOVIR 500 MG PO TABS: ORAL_TABLET | ORAL | 1 refills | Status: DC

## 2017-05-14 NOTE — Progress Notes (Signed)
GYNECOLOGY  VISIT   HPI: 31 y.o.   Single  African American  female   G0P0 with Patient's last menstrual period was 04/14/2017.   here for  HSV discussion/med change; patient states that she had an outbreak about 2 weeks ago, she does not feel as if Famciclovir is as affective as it should be.   Was taking it twice a day initially.  States it went away faster when she took it three times a day.   Feels like every itch is an outbreak but she knows that it not true.   No current outbreak now.  No itching or burning.   Wants to take as prophylaxis.   Has itching around her face.  This started after she started the Famvir. No tongue swelling.  No other itching on the rest of her body.  Has eczema.   Having a laparoscopic myomectomy next week.  Feeling nervous and under stress.   GYNECOLOGIC HISTORY: Patient's last menstrual period was 04/14/2017. Contraception:  Abstinence Menopausal hormone therapy:  n/a Last mammogram:  n/a Last pap smear:   07-24-15 Neg          OB History    Gravida Para Term Preterm AB Living   0             SAB TAB Ectopic Multiple Live Births                     Patient Active Problem List   Diagnosis Date Noted  . Fibroids 08/06/2013    Past Medical History:  Diagnosis Date  . Dysmenorrhea   . Eczema    arms and neck  . Fibroid   . Heart murmur    dx'd as a teenager and told would outgrow  . 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  Marland Kitchen Hormone disorder    enlarged thyroid  . Psoriasis of scalp   . Seasonal allergies   . STD (sexually transmitted disease)    Tx'd for Chlamydia and Gonorrhea age 4    Past Surgical History:  Procedure Laterality Date  . EYE SURGERY     to remove styes at age 23  . wisdom teeth Bilateral     Current Outpatient Prescriptions  Medication Sig Dispense Refill  . acetaminophen (TYLENOL) 500 MG chewable tablet Chew 500 mg by mouth every 6 (six) hours as needed for pain.    . clobetasol ointment (TEMOVATE)  8.18 % Apply 1 application topically as needed.     . famciclovir (FAMVIR) 250 MG tablet Take two tablets (500 mg) orally once.  Then take one tablet (250 mg) by mouth twice a day for 2 days as needed for an outbreak. 60 tablet 0  . lidocaine (XYLOCAINE) 5 % ointment Apply 1 application topically 3 (three) times daily. 1.25 g 0  . norethindrone (AYGESTIN) 5 MG tablet Take 5 mg by mouth daily.     No current facility-administered medications for this visit.      ALLERGIES: Acyclovir and related; Ampicillin; Pollen extract; and Valtrex [valacyclovir]  Family History  Problem Relation Age of Onset  . Hypertension Mother   . Thyroid disease Mother   . Diabetes Father   . Hypertension Father   . Diabetes Maternal Grandmother   . Hypertension Maternal Grandmother   . Stroke Maternal Grandmother   . Diabetes Maternal Grandfather   . Hypertension Maternal Grandfather   . Hypertension Paternal Grandmother   . Hypertension Paternal Grandfather  Social History   Social History  . Marital status: Single    Spouse name: N/A  . Number of children: N/A  . Years of education: N/A   Occupational History  . Not on file.   Social History Main Topics  . Smoking status: Never Smoker  . Smokeless tobacco: Never Used  . Alcohol use 0.0 oz/week     Comment: once every few months  . Drug use: No  . Sexual activity: Not Currently    Partners: Male    Birth control/ protection: None   Other Topics Concern  . Not on file   Social History Narrative  . No narrative on file    ROS:  Pertinent items are noted in HPI.  PHYSICAL EXAMINATION:    BP 116/70 (BP Location: Right Arm, Patient Position: Sitting, Cuff Size: Normal)   Pulse 88   Resp 16   Wt 188 lb (85.3 kg)   LMP 04/14/2017   BMI 34.11 kg/m     General appearance: alert, cooperative and appears stated age  ASSESSMENT  HSV.  Increased outbreaks due to current stress. Facial itching.  On Norethindrone.  Fibroid uterus.   Upcoming surgery. Situational stress.  PLAN  OK to increase to Famvir 500 mg po bid.  I do not think the pruritis is due to an allergic reaction or HSV outbreak. Discussed stress reduction through support of family and friends.  Myomectomy with [facility] laparoscopy group. She will follow up in October for her annual exam.  She states she feels better after her visit today but knows she can call back to see me if needed before October.   An After Visit Summary was printed and given to the patient.  _15_____ minutes face to face time of which over 50% was spent in counseling.

## 2017-05-21 HISTORY — PX: MYOMECTOMY: SHX85

## 2017-06-05 DIAGNOSIS — B009 Herpesviral infection, unspecified: Secondary | ICD-10-CM | POA: Insufficient documentation

## 2017-06-07 ENCOUNTER — Other Ambulatory Visit: Payer: Self-pay | Admitting: Obstetrics and Gynecology

## 2017-06-26 ENCOUNTER — Encounter: Payer: Self-pay | Admitting: Obstetrics and Gynecology

## 2017-08-08 ENCOUNTER — Ambulatory Visit: Payer: BC Managed Care – PPO | Admitting: Obstetrics and Gynecology

## 2017-08-18 ENCOUNTER — Other Ambulatory Visit: Payer: Self-pay | Admitting: Obstetrics and Gynecology

## 2017-08-18 NOTE — Telephone Encounter (Signed)
Medication refill request: Famvir  Last AEX:  08-05-16  Next AEX: 09-01-17  Last MMG (if hormonal medication request): N/A Refill authorized: please advise

## 2017-08-28 ENCOUNTER — Ambulatory Visit (INDEPENDENT_AMBULATORY_CARE_PROVIDER_SITE_OTHER): Payer: BC Managed Care – PPO | Admitting: Licensed Clinical Social Worker

## 2017-08-28 DIAGNOSIS — F329 Major depressive disorder, single episode, unspecified: Secondary | ICD-10-CM

## 2017-08-29 NOTE — Progress Notes (Signed)
31 y.o. G0P0 Single African American female here for annual exam.    Menses are improved since having laparoscopic myomectomy.  They are regular.  No significant pain or discomfort.   HSV monthly around the time of her menses.  Taking Famvir and Lidocaine ointment. this helps.   Having a change on her tongue that comes and goes.   Labs with PCP.   PCP:   [facility]  Patient's last menstrual period was 08/27/2017.           Sexually active: No.  The current method of family planning is abstinence.    Exercising: No.  The patient does not participate in regular exercise at present. Smoker:  no  Health Maintenance: Pap: 07-24-15 Neg History of abnormal Pap:  no MMG:  n/a Colonoscopy:  n/a BMD:   n/a  Result  n/a TDaP:  PCP Gardasil:   yes HIV: 01-08-17 NR Hep C: 01-08-17 Neg Screening Labs:  PCP   reports that she has never smoked. She has never used smokeless tobacco. She reports that she drinks alcohol. She reports that she does not use drugs.  Past Medical History:  Diagnosis Date  . Dysmenorrhea   . Eczema    arms and neck  . Fibroid   . Heart murmur    dx'd as a teenager and told would outgrow  . 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  Marland Kitchen Hormone disorder    enlarged thyroid  . Psoriasis of scalp   . Seasonal allergies   . STD (sexually transmitted disease)    Tx'd for Chlamydia and Gonorrhea age 62    Past Surgical History:  Procedure Laterality Date  . EYE SURGERY     to remove styes at age 55  . MYOMECTOMY  05/21/2017   laparoscopic - labor and vaginal delivery not recommended by [facility]  . wisdom teeth Bilateral     Current Outpatient Prescriptions  Medication Sig Dispense Refill  . acetaminophen (TYLENOL) 500 MG chewable tablet Chew 500 mg by mouth every 6 (six) hours as needed for pain.    . clobetasol ointment (TEMOVATE) 1.61 % Apply 1 application topically as needed.     . famciclovir (FAMVIR) 500 MG tablet TAKE ONE TABLET BY MOUTH TWICE  A DAY FOR PREVENTION OF INFECTION. 60 tablet 0  . lidocaine (XYLOCAINE) 5 % ointment Apply 1 application topically 3 (three) times daily. 1.25 g 0   No current facility-administered medications for this visit.     Family History  Problem Relation Age of Onset  . Hypertension Mother   . Thyroid disease Mother   . Diabetes Father   . Hypertension Father   . Diabetes Maternal Grandmother   . Hypertension Maternal Grandmother   . Stroke Maternal Grandmother   . Diabetes Maternal Grandfather   . Hypertension Maternal Grandfather   . Hypertension Paternal Grandmother   . Hypertension Paternal Grandfather     ROS:  Pertinent items are noted in HPI.  Otherwise, a comprehensive ROS was negative.  Exam:   BP 120/72 (BP Location: Right Arm, Patient Position: Sitting, Cuff Size: Large)   Pulse 88   Resp 14   Ht 5' 3.75" (1.619 m)   Wt 191 lb 9.6 oz (86.9 kg)   LMP 08/27/2017   BMI 33.15 kg/m     General appearance: alert, cooperative and appears stated age Head: Normocephalic, without obvious abnormality, atraumatic Neck: no adenopathy, supple, symmetrical, trachea midline and thyroid normal to inspection and palpation  Lungs: clear to auscultation bilaterally Breasts: normal appearance, no masses or tenderness, No nipple retraction or dimpling, No nipple discharge or bleeding, No axillary or supraclavicular adenopathy Heart: regular rate and rhythm Abdomen: soft, non-tender; no masses, no organomegaly Extremities: extremities normal, atraumatic, no cyanosis or edema Skin: Skin color, texture, turgor normal. No rashes or lesions Lymph nodes: Cervical, supraclavicular, and axillary nodes normal. No abnormal inguinal nodes palpated Neurologic: Grossly normal  Pelvic: External genitalia:  no lesions              Urethra:  normal appearing urethra with no masses, tenderness or lesions              Bartholins and Skenes: normal                 Vagina: normal appearing vagina with normal  color and discharge, no lesions              Cervix: no lesions.  Menstrual flow noted.              Pap taken: No. Bimanual Exam:  Uterus:  normal size, contour, position, consistency, mobility, non-tender              Adnexa: no mass, fullness, tenderness         Chaperone was present for exam.  Assessment:   Well woman visit with normal exam. Status post laparoscopic myomectomy.  Hx HSV.  Plan: Mammogram screening discussed. Recommended self breast awareness. Pap and HR HPV as above. Guidelines for Calcium, Vitamin D, regular exercise program including cardiovascular and weight bearing exercise. I discussed Cesarean Section for delivery for future pregnancies. Famvir and Lidocaine prn HSV outbreak.  I discussed potential daily antiviral medication for reduction of asymptomatic shedding.   Follow up annually and prn.   After visit summary provided.

## 2017-09-01 ENCOUNTER — Ambulatory Visit (INDEPENDENT_AMBULATORY_CARE_PROVIDER_SITE_OTHER): Payer: BC Managed Care – PPO | Admitting: Obstetrics and Gynecology

## 2017-09-01 ENCOUNTER — Encounter: Payer: Self-pay | Admitting: Obstetrics and Gynecology

## 2017-09-01 VITALS — BP 120/72 | HR 88 | Resp 14 | Ht 63.75 in | Wt 191.6 lb

## 2017-09-01 DIAGNOSIS — Z01419 Encounter for gynecological examination (general) (routine) without abnormal findings: Secondary | ICD-10-CM

## 2017-09-01 MED ORDER — LIDOCAINE 5 % EX OINT: 1.0000 "application " | TOPICAL_OINTMENT | Freq: Three times a day (TID) | CUTANEOUS | 0 refills | Status: DC

## 2017-09-01 NOTE — Patient Instructions (Signed)

## 2017-09-19 ENCOUNTER — Other Ambulatory Visit: Payer: Self-pay | Admitting: Obstetrics and Gynecology

## 2017-09-19 NOTE — Telephone Encounter (Signed)
Medication refill request: Famvir  Last AEX:  09/01/17 BS Next AEX: 09/09/18  Last MMG (if hormonal medication request): n/a Refill authorized: 08/18/17 #[id number], 0RF. Today, please advise.

## 2017-10-01 ENCOUNTER — Ambulatory Visit: Payer: BC Managed Care – PPO | Admitting: Licensed Clinical Social Worker

## 2017-12-15 ENCOUNTER — Ambulatory Visit (INDEPENDENT_AMBULATORY_CARE_PROVIDER_SITE_OTHER): Payer: BC Managed Care – PPO | Admitting: Licensed Clinical Social Worker

## 2017-12-15 DIAGNOSIS — F324 Major depressive disorder, single episode, in partial remission: Secondary | ICD-10-CM

## 2018-05-05 ENCOUNTER — Ambulatory Visit (INDEPENDENT_AMBULATORY_CARE_PROVIDER_SITE_OTHER): Payer: BC Managed Care – PPO | Admitting: Licensed Clinical Social Worker

## 2018-05-05 DIAGNOSIS — F4321 Adjustment disorder with depressed mood: Secondary | ICD-10-CM | POA: Diagnosis not present

## 2018-05-20 ENCOUNTER — Ambulatory Visit (INDEPENDENT_AMBULATORY_CARE_PROVIDER_SITE_OTHER): Payer: BC Managed Care – PPO | Admitting: Licensed Clinical Social Worker

## 2018-05-20 DIAGNOSIS — F324 Major depressive disorder, single episode, in partial remission: Secondary | ICD-10-CM | POA: Diagnosis not present

## 2018-06-30 ENCOUNTER — Ambulatory Visit (INDEPENDENT_AMBULATORY_CARE_PROVIDER_SITE_OTHER): Payer: BC Managed Care – PPO | Admitting: Licensed Clinical Social Worker

## 2018-06-30 DIAGNOSIS — F324 Major depressive disorder, single episode, in partial remission: Secondary | ICD-10-CM

## 2018-07-30 ENCOUNTER — Ambulatory Visit (INDEPENDENT_AMBULATORY_CARE_PROVIDER_SITE_OTHER): Payer: BC Managed Care – PPO | Admitting: Licensed Clinical Social Worker

## 2018-07-30 DIAGNOSIS — F324 Major depressive disorder, single episode, in partial remission: Secondary | ICD-10-CM | POA: Diagnosis not present

## 2018-08-25 ENCOUNTER — Ambulatory Visit (INDEPENDENT_AMBULATORY_CARE_PROVIDER_SITE_OTHER): Payer: BC Managed Care – PPO | Admitting: Licensed Clinical Social Worker

## 2018-08-25 DIAGNOSIS — F324 Major depressive disorder, single episode, in partial remission: Secondary | ICD-10-CM

## 2018-09-09 ENCOUNTER — Ambulatory Visit: Payer: BC Managed Care – PPO | Admitting: Obstetrics and Gynecology

## 2018-09-14 ENCOUNTER — Ambulatory Visit: Payer: BC Managed Care – PPO | Admitting: Obstetrics and Gynecology

## 2018-09-14 ENCOUNTER — Encounter: Payer: Self-pay | Admitting: Obstetrics and Gynecology

## 2018-09-14 VITALS — BP 118/78 | HR 66 | Resp 14 | Ht 62.5 in | Wt 183.6 lb

## 2018-09-14 DIAGNOSIS — Z01419 Encounter for gynecological examination (general) (routine) without abnormal findings: Secondary | ICD-10-CM

## 2018-09-14 NOTE — Patient Instructions (Signed)
EXERCISE AND DIET:  We recommended that you start or continue a regular exercise program for good health. Regular exercise means any activity that makes your heart beat faster and makes you sweat.  We recommend exercising at least 30 minutes per day at least 3 days a week, preferably 4 or 5.  We also recommend a diet low in fat and sugar.  Inactivity, poor dietary choices and obesity can cause diabetes, heart attack, stroke, and kidney damage, among others.    ALCOHOL AND SMOKING:  Women should limit their alcohol intake to no more than 7 drinks/beers/glasses of wine (combined, not each!) per week. Moderation of alcohol intake to this level decreases your risk of breast cancer and liver damage. And of course, no recreational drugs are part of a healthy lifestyle.  And absolutely no smoking or even second hand smoke. Most people know smoking can cause heart and lung diseases, but did you know it also contributes to weakening of your bones? Aging of your skin?  Yellowing of your teeth and nails?  CALCIUM AND VITAMIN D:  Adequate intake of calcium and Vitamin D are recommended.  The recommendations for exact amounts of these supplements seem to change often, but generally speaking 600 mg of calcium (either carbonate or citrate) and 800 units of Vitamin D per day seems prudent. Certain women may benefit from higher intake of Vitamin D.  If you are among these women, your doctor will have told you during your visit.    PAP SMEARS:  Pap smears, to check for cervical cancer or precancers,  have traditionally been done yearly, although recent scientific advances have shown that most women can have pap smears less often.  However, every woman still should have a physical exam from her gynecologist every year. It will include a breast check, inspection of the vulva and vagina to check for abnormal growths or skin changes, a visual exam of the cervix, and then an exam to evaluate the size and shape of the uterus and  ovaries.  And after 30 years of age, a rectal exam is indicated to check for rectal cancers. We will also provide age appropriate advice regarding health maintenance, like when you should have certain vaccines, screening for sexually transmitted diseases, bone density testing, colonoscopy, mammograms, etc.   MAMMOGRAMS:  All women over 40 years old should have a yearly mammogram. Many facilities now offer a "3D" mammogram, which may cost around $50 extra out of pocket. If possible,  we recommend you accept the option to have the 3D mammogram performed.  It both reduces the number of women who will be called back for extra views which then turn out to be normal, and it is better than the routine mammogram at detecting truly abnormal areas.    COLONOSCOPY:  Colonoscopy to screen for colon cancer is recommended for all women at age 50.  We know, you hate the idea of the prep.  We agree, BUT, having colon cancer and not knowing it is worse!!  Colon cancer so often starts as a polyp that can be seen and removed at colonscopy, which can quite literally save your life!  And if your first colonoscopy is normal and you have no family history of colon cancer, most women don't have to have it again for 10 years.  Once every ten years, you can do something that may end up saving your life, right?  We will be happy to help you get it scheduled when you are ready.    Be sure to check your insurance coverage so you understand how much it will cost.  It may be covered as a preventative service at no cost, but you should check your particular policy.      Contraception Choices Contraception, also called birth control, refers to methods or devices that prevent pregnancy. Hormonal methods Contraceptive implant A contraceptive implant is a thin, plastic tube that contains a hormone. It is inserted into the upper part of the arm. It can remain in place for up to 3 years. Progestin-only injections Progestin-only injections  are injections of progestin, a synthetic form of the hormone progesterone. They are given every 3 months by a health care provider. Birth control pills Birth control pills are pills that contain hormones that prevent pregnancy. They must be taken once a day, preferably at the same time each day. Birth control patch The birth control patch contains hormones that prevent pregnancy. It is placed on the skin and must be changed once a week for three weeks and removed on the fourth week. A prescription is needed to use this method of contraception. Vaginal ring A vaginal ring contains hormones that prevent pregnancy. It is placed in the vagina for three weeks and removed on the fourth week. After that, the process is repeated with a new ring. A prescription is needed to use this method of contraception. Emergency contraceptive Emergency contraceptives prevent pregnancy after unprotected sex. They come in pill form and can be taken up to 5 days after sex. They work best the sooner they are taken after having sex. Most emergency contraceptives are available without a prescription. This method should not be used as your only form of birth control. Barrier methods Female condom A female condom is a thin sheath that is worn over the penis during sex. Condoms keep sperm from going inside a woman's body. They can be used with a spermicide to increase their effectiveness. They should be disposed after a single use. Female condom A female condom is a soft, loose-fitting sheath that is put into the vagina before sex. The condom keeps sperm from going inside a woman's body. They should be disposed after a single use. Diaphragm A diaphragm is a soft, dome-shaped barrier. It is inserted into the vagina before sex, along with a spermicide. The diaphragm blocks sperm from entering the uterus, and the spermicide kills sperm. A diaphragm should be left in the vagina for 6-8 hours after sex and removed within 24 hours. A  diaphragm is prescribed and fitted by a health care provider. A diaphragm should be replaced every 1-2 years, after giving birth, after gaining more than 15 lb (6.8 kg), and after pelvic surgery. Cervical cap A cervical cap is a round, soft latex or plastic cup that fits over the cervix. It is inserted into the vagina before sex, along with spermicide. It blocks sperm from entering the uterus. The cap should be left in place for 6-8 hours after sex and removed within 48 hours. A cervical cap must be prescribed and fitted by a health care provider. It should be replaced every 2 years. Sponge A sponge is a soft, circular piece of polyurethane foam with spermicide on it. The sponge helps block sperm from entering the uterus, and the spermicide kills sperm. To use it, you make it wet and then insert it into the vagina. It should be inserted before sex, left in for at least 6 hours after sex, and removed and thrown away within 30 hours. Spermicides Spermicides  are chemicals that kill or block sperm from entering the cervix and uterus. They can come as a cream, jelly, suppository, foam, or tablet. A spermicide should be inserted into the vagina with an applicator at least 35-32 minutes before sex to allow time for it to work. The process must be repeated every time you have sex. Spermicides do not require a prescription. Intrauterine contraception Intrauterine device (IUD) An IUD is a T-shaped device that is put in a woman's uterus. There are two types:  Hormone IUD.This type contains progestin, a synthetic form of the hormone progesterone. This type can stay in place for 3-5 years.  Copper IUD.This type is wrapped in copper wire. It can stay in place for 10 years.  Permanent methods of contraception Female tubal ligation In this method, a woman's fallopian tubes are sealed, tied, or blocked during surgery to prevent eggs from traveling to the uterus. Hysteroscopic sterilization In this method, a small,  flexible insert is placed into each fallopian tube. The inserts cause scar tissue to form in the fallopian tubes and block them, so sperm cannot reach an egg. The procedure takes about 3 months to be effective. Another form of birth control must be used during those 3 months. Female sterilization This is a procedure to tie off the tubes that carry sperm (vasectomy). After the procedure, the man can still ejaculate fluid (semen). Natural planning methods Natural family planning In this method, a couple does not have sex on days when the woman could become pregnant. Calendar method This means keeping track of the length of each menstrual cycle, identifying the days when pregnancy can happen, and not having sex on those days. Ovulation method In this method, a couple avoids sex during ovulation. Symptothermal method This method involves not having sex during ovulation. The woman typically checks for ovulation by watching changes in her temperature and in the consistency of cervical mucus. Post-ovulation method In this method, a couple waits to have sex until after ovulation. Summary  Contraception, also called birth control, means methods or devices that prevent pregnancy.  Hormonal methods of contraception include implants, injections, pills, patches, vaginal rings, and emergency contraceptives.  Barrier methods of contraception can include female condoms, female condoms, diaphragms, cervical caps, sponges, and spermicides.  There are two types of IUDs (intrauterine devices). An IUD can be put in a woman's uterus to prevent pregnancy for 3-5 years.  Permanent sterilization can be done through a procedure for males, females, or both.  Natural family planning methods involve not having sex on days when the woman could become pregnant. This information is not intended to replace advice given to you by your health care provider. Make sure you discuss any questions you have with your health care  provider. Document Released: 10/21/2005 Document Revised: 11/23/2016 Document Reviewed: 11/23/2016 Elsevier Interactive Patient Education  2018 [employer].

## 2018-09-14 NOTE — Progress Notes (Signed)
32 y.o. G0P0 Single African American female here for annual exam.     Menses with heavy flow but not too heavy.  No real cramping.   Considering birth control. Used NuvaRing in the past.  Denies HTN, migraine with aura, liver or breast disease, DVT/PE or family history of this.   Does have menstrual headaches.  The headaches are not debilitating.   Declines STD screening.  Not active in the last year.   Following healthy diet and exercise.   Declines refill of antiviral.  Not having outbreaks.  Friend 19 yo died of CHF.   PCP:   [facility]   Patient's last menstrual period was 09/11/2018.           Sexually active: No.  The current method of family planning is none.    Exercising: Yes.    Gym/ health club routine includes cardio and light weights. Smoker:  no  Health Maintenance: Pap: 07-24-15 Neg History of abnormal Pap:  no MMG: n/a Colonoscopy:  n/a BMD:   n/a  Result  n/a TDaP:  2017 per patient  Gardasil:   yes HIV: 01-08-17 NR Hep C: 01-08-17 Neg Screening Labs:  Hb today:, Urine today: no    reports that she has never smoked. She has never used smokeless tobacco. She reports that she drinks alcohol. She reports that she does not use drugs.  Past Medical History:  Diagnosis Date  . Dysmenorrhea   . Eczema    arms and neck  . Fibroid   . Heart murmur    dx'd as a teenager and told would outgrow  . 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  Marland Kitchen Hormone disorder    enlarged thyroid  . Psoriasis of scalp   . Seasonal allergies   . STD (sexually transmitted disease)    Tx'd for Chlamydia and Gonorrhea age 6    Past Surgical History:  Procedure Laterality Date  . EYE SURGERY     to remove styes at age 21  . MYOMECTOMY  05/21/2017   laparoscopic - labor and vaginal delivery not recommended by [facility]  . wisdom teeth Bilateral     Current Outpatient Medications  Medication Sig Dispense Refill  . acetaminophen (TYLENOL) 500 MG chewable tablet Chew  500 mg by mouth every 6 (six) hours as needed for pain.    . clobetasol ointment (TEMOVATE) 8.12 % Apply 1 application topically as needed.     . famciclovir (FAMVIR) 500 MG tablet TAKE ONE TABLET BY MOUTH TWICE A DAY FOR PREVENTION OF INFECTION. 60 tablet 10   No current facility-administered medications for this visit.     Family History  Problem Relation Age of Onset  . Hypertension Mother   . Thyroid disease Mother   . Diabetes Father   . Hypertension Father   . Diabetes Maternal Grandmother   . Hypertension Maternal Grandmother   . Stroke Maternal Grandmother   . Diabetes Maternal Grandfather   . Hypertension Maternal Grandfather   . Hypertension Paternal Grandmother   . Hypertension Paternal Grandfather     Review of Systems  Respiratory: Positive for cough.        Cold  Skin:       itching  Psychiatric/Behavioral: The patient is nervous/anxious.     Exam:   BP 118/78   Pulse 66   Resp 14   Ht 5' 2.5" (1.588 m)   Wt 183 lb 9.6 oz (83.3 kg)   LMP 09/11/2018  BMI 33.05 kg/m     General appearance: alert, cooperative and appears stated age Head: Normocephalic, without obvious abnormality, atraumatic Neck: no adenopathy, supple, symmetrical, trachea midline and thyroid normal to inspection and palpation Lungs: clear to auscultation bilaterally Breasts: normal appearance, no masses or tenderness, No nipple retraction or dimpling, No nipple discharge or bleeding, No axillary or supraclavicular adenopathy Heart: regular rate and rhythm Abdomen: soft, non-tender; no masses, no organomegaly Extremities: extremities normal, atraumatic, no cyanosis or edema Skin: Skin color, texture, turgor normal. No rashes or lesions Lymph nodes: Cervical, supraclavicular, and axillary nodes normal. No abnormal inguinal nodes palpated Neurologic: Grossly normal  Pelvic: External genitalia:  no lesions              Urethra:  normal appearing urethra with no masses, tenderness or  lesions              Bartholins and Skenes: normal                 Vagina: normal appearing vagina with normal color and discharge, no lesions              Cervix: no lesions.  Menstrual blood and clot noted.               Pap taken: No. Bimanual Exam:  Uterus:  normal size, contour, position, consistency, mobility, non-tender         Chaperone was present for exam.  Assessment:   Well woman visit with normal exam. Status post laparoscopic myomectomy.  Hx HSV.  Plan: Mammogram screening. Recommended self breast awareness. Pap and HR HPV in one week.  Calcium, Vitamin D, regular exercise program including cardiovascular and weight bearing exercise. Discussed flu vaccine.  Routine labs today.  Flu vaccine recommended. We discussed options for birth control.   Written information given.  Follow up annually and prn.   After visit summary provided.

## 2018-09-15 LAB — COMPREHENSIVE METABOLIC PANEL
A/G RATIO: 1.5 (ref 1.2–2.2)
ALBUMIN: 4.3 g/dL (ref 3.5–5.5)
ALT: 10 IU/L (ref 0–32)
AST: 18 IU/L (ref 0–40)
Alkaline Phosphatase: 60 IU/L (ref 39–117)
BILIRUBIN TOTAL: 0.5 mg/dL (ref 0.0–1.2)
BUN / CREAT RATIO: 10 (ref 9–23)
BUN: 9 mg/dL (ref 6–20)
CALCIUM: 9.2 mg/dL (ref 8.7–10.2)
CHLORIDE: 104 mmol/L (ref 96–106)
CO2: 23 mmol/L (ref 20–29)
Creatinine, Ser: 0.92 mg/dL (ref 0.57–1.00)
GFR, EST AFRICAN AMERICAN: 97 mL/min/{1.73_m2} (ref >59)
GFR, EST NON AFRICAN AMERICAN: 84 mL/min/{1.73_m2} (ref >59)
Globulin, Total: 2.9 g/dL (ref 1.5–4.5)
Glucose: 85 mg/dL (ref 65–99)
POTASSIUM: 4.1 mmol/L (ref 3.5–5.2)
Sodium: 141 mmol/L (ref 134–144)
TOTAL PROTEIN: 7.2 g/dL (ref 6.0–8.5)

## 2018-09-15 LAB — LIPID PANEL
Chol/HDL Ratio: 3.2 ratio (ref 0.0–4.4)
Cholesterol, Total: 152 mg/dL (ref 100–199)
HDL: 47 mg/dL (ref >39)
LDL Calculated: 89 mg/dL (ref 0–99)
TRIGLYCERIDES: 78 mg/dL (ref 0–149)
VLDL Cholesterol Cal: 16 mg/dL (ref 5–40)

## 2018-09-25 ENCOUNTER — Ambulatory Visit: Payer: BC Managed Care – PPO | Admitting: Obstetrics and Gynecology

## 2018-09-28 ENCOUNTER — Ambulatory Visit: Payer: BC Managed Care – PPO | Admitting: Licensed Clinical Social Worker

## 2018-09-30 ENCOUNTER — Other Ambulatory Visit: Payer: Self-pay

## 2018-09-30 ENCOUNTER — Other Ambulatory Visit (HOSPITAL_COMMUNITY)
Admission: RE | Admit: 2018-09-30 | Discharge: 2018-09-30 | Disposition: A | Discharge status: Home / Self Care | Payer: BC Managed Care – PPO | Source: Ambulatory Visit | Attending: Obstetrics and Gynecology | Admitting: Obstetrics and Gynecology

## 2018-09-30 ENCOUNTER — Encounter: Payer: Self-pay | Admitting: Obstetrics and Gynecology

## 2018-09-30 ENCOUNTER — Ambulatory Visit (INDEPENDENT_AMBULATORY_CARE_PROVIDER_SITE_OTHER): Payer: BC Managed Care – PPO | Admitting: Obstetrics and Gynecology

## 2018-09-30 VITALS — BP 104/70 | HR 66 | Ht 62.25 in | Wt 186.0 lb

## 2018-09-30 DIAGNOSIS — Z124 Encounter for screening for malignant neoplasm of cervix: Secondary | ICD-10-CM | POA: Insufficient documentation

## 2018-09-30 DIAGNOSIS — Z3009 Encounter for other general counseling and advice on contraception: Secondary | ICD-10-CM | POA: Diagnosis not present

## 2018-09-30 DIAGNOSIS — Z0000 Encounter for general adult medical examination without abnormal findings: Secondary | ICD-10-CM | POA: Diagnosis not present

## 2018-09-30 DIAGNOSIS — D649 Anemia, unspecified: Secondary | ICD-10-CM

## 2018-09-30 MED ORDER — FAMCICLOVIR 500 MG PO TABS: ORAL_TABLET | ORAL | 1 refills | Status: DC

## 2018-09-30 MED ORDER — ETONOGESTREL-ETHINYL ESTRADIOL 0.12-0.015 MG/24HR VA RING: 1.0000 | VAGINAL_RING | VAGINAL | 3 refills | Status: DC

## 2018-09-30 NOTE — Progress Notes (Signed)
GYNECOLOGY  VISIT   HPI: 32 y.o.   Single  African American  female   G0P0 with Patient's last menstrual period was 09/11/2018 (exact date).   here for pap smear.  She was bleeding at the time of her annual exam and could not complete this.   Has menstrual headaches.    Needs birth control.  Used NuvaRing previously.  Wants Famvir.  Last outbreak was in June.   GYNECOLOGIC HISTORY: Patient's last menstrual period was 09/11/2018 (exact date). Contraception:  none Menopausal hormone therapy:  none Last mammogram:  n/a Last pap smear:  07-24-15 Neg         OB History    Gravida  0   Para      Term      Preterm      AB      Living        SAB      TAB      Ectopic      Multiple      Live Births                 There are no active problems to display for this patient.   Past Medical History:  Diagnosis Date  . Dysmenorrhea   . Eczema    arms and neck  . Fibroid   . Heart murmur    dx'd as a teenager and told would outgrow  . 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  Marland Kitchen Hormone disorder    enlarged thyroid  . Psoriasis of scalp   . Seasonal allergies   . STD (sexually transmitted disease)    Tx'd for Chlamydia and Gonorrhea age 48    Past Surgical History:  Procedure Laterality Date  . EYE SURGERY     to remove styes at age 79  . MYOMECTOMY  05/21/2017   laparoscopic - labor and vaginal delivery not recommended by [facility]  . wisdom teeth Bilateral     Current Outpatient Medications  Medication Sig Dispense Refill  . acetaminophen (TYLENOL) 500 MG chewable tablet Chew 500 mg by mouth every 6 (six) hours as needed for pain.    . clobetasol ointment (TEMOVATE) 1.60 % Apply 1 application topically as needed.     . famciclovir (FAMVIR) 500 MG tablet TAKE ONE TABLET BY MOUTH TWICE A DAY FOR PREVENTION OF INFECTION. 60 tablet 10   No current facility-administered medications for this visit.      ALLERGIES: Acyclovir and related; Ampicillin; Pollen extract;  and Valtrex [valacyclovir]  Family History  Problem Relation Age of Onset  . Hypertension Mother   . Thyroid disease Mother   . Diabetes Father   . Hypertension Father   . Diabetes Maternal Grandmother   . Hypertension Maternal Grandmother   . Stroke Maternal Grandmother   . Diabetes Maternal Grandfather   . Hypertension Maternal Grandfather   . Hypertension Paternal Grandmother   . Hypertension Paternal Grandfather     Social History   Socioeconomic History  . Marital status: Single    Spouse name: Not on file  . Number of children: Not on file  . Years of education: Not on file  . Highest education level: Not on file  Occupational History  . Not on file  Social Needs  . Financial resource strain: Not on file  . Food insecurity:    Worry: Not on file    Inability: Not on file  . Transportation needs:    Medical: Not on  file    Non-medical: Not on file  Tobacco Use  . Smoking status: Never Smoker  . Smokeless tobacco: Never Used  Substance and Sexual Activity  . Alcohol use: Yes    Alcohol/week: 0.0 standard drinks    Comment: once every few months  . Drug use: No  . Sexual activity: Not Currently    Partners: Male    Birth control/protection: None  Lifestyle  . Physical activity:    Days per week: Not on file    Minutes per session: Not on file  . Stress: Not on file  Relationships  . Social connections:    Talks on phone: Not on file    Gets together: Not on file    Attends religious service: Not on file    Active member of club or organization: Not on file    Attends meetings of clubs or organizations: Not on file    Relationship status: Not on file  . Intimate partner violence:    Fear of current or ex partner: Not on file    Emotionally abused: Not on file    Physically abused: Not on file    Forced sexual activity: Not on file  Other Topics Concern  . Not on file  Social History Narrative  . Not on file    Review of Systems  PHYSICAL  EXAMINATION:    BP 104/70 (BP Location: Right Arm, Patient Position: Sitting, Cuff Size: Large)   Pulse 66   Ht 5' 2.25" (1.581 m)   Wt 186 lb (84.4 kg)   LMP 09/11/2018 (Exact Date)   BMI 33.75 kg/m     General appearance: alert, cooperative and appears stated age   Pelvic: External genitalia:  no lesions              Urethra:  normal appearing urethra with no masses, tenderness or lesions              Bartholins and Skenes: normal                 Vagina: normal appearing vagina with normal color and discharge, no lesions              Cervix: no lesions.  Pap and HR HPV collected.                Bimanual Exam:  Uterus:  normal size, contour, position, consistency, mobility, non-tender              Adnexa: no mass, fullness, tenderness                    Chaperone was present for exam.  ASSESSMENT  Cervical cancer screening.  Contraception counseling.  HSV.   PLAN  Pap and HR HPV.  CBC collected today as it was not done at her recent routine exam.  Contraception counseling given and options reviewed - pills, NuvaRing, OrthoEvra, Nexplanon, IUDs, Depo Provera.  She chooses NuvaRing and will do each ring for 28 days to avoid a menstrual HA. Rx for one year.  Rx for Famvir.    An After Visit Summary was printed and given to the patient.  __15____ minutes face to face time of which over 50% was spent in counseling.

## 2018-10-01 LAB — CBC
HEMATOCRIT: 32.9 % — AB (ref 34.0–46.6)
Hemoglobin: 10.3 g/dL — ABNORMAL LOW (ref 11.1–15.9)
MCH: 24.9 pg — AB (ref 26.6–33.0)
MCHC: 31.3 g/dL — AB (ref 31.5–35.7)
MCV: 80 fL (ref 79–97)
Platelets: 337 10*3/uL (ref 150–450)
RBC: 4.14 x10E6/uL (ref 3.77–5.28)
RDW: 14.2 % (ref 12.3–15.4)
WBC: 6 10*3/uL (ref 3.4–10.8)

## 2018-10-02 NOTE — Addendum Note (Signed)
Addended by: Yisroel Ramming, Dietrich Pates E on: 10/02/2018 02:33 PM   Modules accepted: Orders

## 2018-10-05 LAB — CYTOLOGY - PAP
Diagnosis: NEGATIVE
HPV (WINDOPATH): NOT DETECTED

## 2018-10-06 ENCOUNTER — Telehealth: Payer: Self-pay

## 2018-10-06 NOTE — Telephone Encounter (Signed)
-----   Message from Nunzio Cobbs, MD sent at 10/02/2018  2:33 PM EST ----- Please contact patient with results.  She is anemic, and I suspect this is from her menstrual cycles.  I recommend she take over the counter iron sulfate 325 mg daily and return for a lab visit in 6 weeks to recheck this.  I will place future orders for her.

## 2018-10-06 NOTE — Telephone Encounter (Signed)
Spoke with patient. Advised of results as seen below from [place]. Patient verbalizes understanding. 6 week recheck scheduled for 11/18/2017 at 3:30 pm. Patient is agreeable to date and time. Encounter closed.

## 2018-10-22 ENCOUNTER — Ambulatory Visit (INDEPENDENT_AMBULATORY_CARE_PROVIDER_SITE_OTHER): Payer: BC Managed Care – PPO | Admitting: Licensed Clinical Social Worker

## 2018-10-22 DIAGNOSIS — F324 Major depressive disorder, single episode, in partial remission: Secondary | ICD-10-CM | POA: Diagnosis not present

## 2018-11-04 DIAGNOSIS — A539 Syphilis, unspecified: Secondary | ICD-10-CM

## 2018-11-04 HISTORY — DX: Syphilis, unspecified: A53.9

## 2018-11-11 ENCOUNTER — Encounter: Payer: Self-pay | Admitting: Obstetrics and Gynecology

## 2018-11-11 ENCOUNTER — Other Ambulatory Visit: Payer: Self-pay

## 2018-11-11 ENCOUNTER — Ambulatory Visit: Payer: BC Managed Care – PPO | Admitting: Obstetrics and Gynecology

## 2018-11-11 VITALS — BP 124/82 | HR 60 | Ht 62.25 in | Wt 184.0 lb

## 2018-11-11 DIAGNOSIS — Z113 Encounter for screening for infections with a predominantly sexual mode of transmission: Secondary | ICD-10-CM

## 2018-11-11 DIAGNOSIS — N760 Acute vaginitis: Secondary | ICD-10-CM

## 2018-11-11 DIAGNOSIS — Z3009 Encounter for other general counseling and advice on contraception: Secondary | ICD-10-CM

## 2018-11-11 MED ORDER — METRONIDAZOLE 500 MG PO TABS: 500.0000 mg | ORAL_TABLET | Freq: Two times a day (BID) | ORAL | 0 refills | Status: DC

## 2018-11-11 NOTE — Progress Notes (Signed)
GYNECOLOGY  VISIT   HPI: 33 y.o.   Single  African American  female   G0P0 with Patient's last menstrual period was 10/05/2018 (exact date).   here for vaginal discharge with odor and some lower pelvic discomfort.   Discharge for 2 days.   No burning or itching.   Hx BV.  Re- united with prior partner.  Intercourse was 3 days ago, and not prior to this since she started her NuvaRing.   Lower pelvic discomfort off and on.  None today.   Started NuvaRing Christmas Eve.  (She had not been sexually active prior to starting this.) Having some headaches.  Resolved with Tylenol.  No light sensitivity or nausea with it.   GYNECOLOGIC HISTORY: Patient's last menstrual period was 10/05/2018 (exact date). Contraception: Nuvaring Menopausal hormone therapy:  n/a Last mammogram:  n/a Last pap smear: 09-30-18 Neg:Neg HR HPV, 07-24-15 Neg        OB History    Gravida  0   Para      Term      Preterm      AB      Living        SAB      TAB      Ectopic      Multiple      Live Births                 There are no active problems to display for this patient.   Past Medical History:  Diagnosis Date  . Dysmenorrhea   . Eczema    arms and neck  . Fibroid   . Heart murmur    dx'd as a teenager and told would outgrow  . 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  Marland Kitchen Hormone disorder    enlarged thyroid  . Psoriasis of scalp   . Seasonal allergies   . STD (sexually transmitted disease)    Tx'd for Chlamydia and Gonorrhea age 66    Past Surgical History:  Procedure Laterality Date  . EYE SURGERY     to remove styes at age 78  . MYOMECTOMY  05/21/2017   laparoscopic - labor and vaginal delivery not recommended by [facility]  . wisdom teeth Bilateral     Current Outpatient Medications  Medication Sig Dispense Refill  . acetaminophen (TYLENOL) 500 MG chewable tablet Chew 500 mg by mouth every 6 (six) hours as needed for pain.    . clobetasol ointment (TEMOVATE) 1.74 %  Apply 1 application topically as needed.     . etonogestrel-ethinyl estradiol (NUVARING) 0.12-0.015 MG/24HR vaginal ring Place 1 each vaginally every 28 (twenty-eight) days. Place a new ring every 28 days. 3 each 3  . famciclovir (FAMVIR) 500 MG tablet Take two tablets (1000 mg) by mouth twice a day for one day as needed. 30 tablet 1  . metroNIDAZOLE (FLAGYL) 500 MG tablet Take 1 tablet (500 mg total) by mouth 2 (two) times daily. 14 tablet 0   No current facility-administered medications for this visit.      ALLERGIES: Acyclovir and related; Ampicillin; Pollen extract; and Valtrex [valacyclovir]  Family History  Problem Relation Age of Onset  . Hypertension Mother   . Thyroid disease Mother   . Diabetes Father   . Hypertension Father   . Diabetes Maternal Grandmother   . Hypertension Maternal Grandmother   . Stroke Maternal Grandmother   . Diabetes Maternal Grandfather   . Hypertension Maternal Grandfather   . Hypertension Paternal  Grandmother   . Hypertension Paternal Grandfather     Social History   Socioeconomic History  . Marital status: Single    Spouse name: Not on file  . Number of children: Not on file  . Years of education: Not on file  . Highest education level: Not on file  Occupational History  . Not on file  Social Needs  . Financial resource strain: Not on file  . Food insecurity:    Worry: Not on file    Inability: Not on file  . Transportation needs:    Medical: Not on file    Non-medical: Not on file  Tobacco Use  . Smoking status: Never Smoker  . Smokeless tobacco: Never Used  Substance and Sexual Activity  . Alcohol use: Yes    Alcohol/week: 0.0 standard drinks    Comment: once every few months  . Drug use: No  . Sexual activity: Not Currently    Partners: Male    Birth control/protection: None  Lifestyle  . Physical activity:    Days per week: Not on file    Minutes per session: Not on file  . Stress: Not on file  Relationships  .  Social connections:    Talks on phone: Not on file    Gets together: Not on file    Attends religious service: Not on file    Active member of club or organization: Not on file    Attends meetings of clubs or organizations: Not on file    Relationship status: Not on file  . Intimate partner violence:    Fear of current or ex partner: Not on file    Emotionally abused: Not on file    Physically abused: Not on file    Forced sexual activity: Not on file  Other Topics Concern  . Not on file  Social History Narrative  . Not on file    Review of Systems  All other systems reviewed and are negative.   PHYSICAL EXAMINATION:    BP 124/82 (BP Location: Right Arm, Patient Position: Sitting, Cuff Size: Large)   Pulse 60   Ht 5' 2.25" (1.581 m)   Wt 184 lb (83.5 kg)   LMP 10/05/2018 (Exact Date)   BMI 33.38 kg/m     General appearance: alert, cooperative and appears stated age    Pelvic: External genitalia:  no lesions              Urethra:  normal appearing urethra with no masses, tenderness or lesions              Bartholins and Skenes: normal                 Vagina: normal appearing vagina with normal color and discharge, no lesions              Cervix: no lesions.  Streak of blood noted from os.  NuvaRing in place.                Bimanual Exam:  Uterus:  normal size, contour, position, consistency, mobility, non-tender              Adnexa: no mass, fullness, tenderness              Chaperone was present for exam.  ASSESSMENT  Vaginitis.  STD screening.  HA with NuvaRing.  Anemia.   PLAN  Nuswab.  She will start Flagyl 500 mg po bid x 7 days.  Additional tx  to follow pending results of Affirm.  She will monitor her HA and let me know if this is persistent.  I educated her that it may be from the NuvaRing and that we may need to consider another option if it persists. Has lab follow up later this month.   An After Visit Summary was printed and given to the  patient.  __15____ minutes face to face time of which over 50% was spent in counseling.

## 2018-11-11 NOTE — Patient Instructions (Signed)
Vaginitis  Vaginitis is a condition in which the vaginal tissue swells and becomes red (inflamed). This condition is most often caused by a change in the normal balance of bacteria and yeast that live in the vagina. This change causes an overgrowth of certain bacteria or yeast, which causes the inflammation. There are different types of vaginitis, but the most common types are:   Bacterial vaginosis.   Yeast infection (candidiasis).   Trichomoniasis vaginitis. This is a sexually transmitted disease (STD).   Viral vaginitis.   Atrophic vaginitis.   Allergic vaginitis.  What are the causes?  The cause of this condition depends on the type of vaginitis. It can be caused by:   Bacteria (bacterial vaginosis).   Yeast, which is a fungus (yeast infection).   A parasite (trichomoniasis vaginitis).   A virus (viral vaginitis).   Low hormone levels (atrophic vaginitis). Low hormone levels can occur during pregnancy, breastfeeding, or after menopause.   Irritants, such as bubble baths, scented tampons, and feminine sprays (allergic vaginitis).  Other factors can change the normal balance of the yeast and bacteria that live in the vagina. These include:   Antibiotic medicines.   Poor hygiene.   Diaphragms, vaginal sponges, spermicides, birth control pills, and intrauterine devices (IUD).   Sex.   Infection.   Uncontrolled diabetes.   A weakened defense (immune) system.  What increases the risk?  This condition is more likely to develop in women who:   Smoke.   Use vaginal douches, scented tampons, or scented sanitary pads.   Wear tight-fitting pants.   Wear thong underwear.   Use oral birth control pills or an IUD.   Have sex without a condom.   Have multiple sex partners.   Have an STD.   Frequently use the spermicide nonoxynol-9.   Eat lots of foods high in sugar.   Have uncontrolled diabetes.   Have low estrogen levels.   Have a weakened immune system from an immune disorder or medical  treatment.   Are pregnant or breastfeeding.  What are the signs or symptoms?  Symptoms vary depending on the cause of the vaginitis. Common symptoms include:   Abnormal vaginal discharge.  ? The discharge is white, gray, or yellow with bacterial vaginosis.  ? The discharge is thick, white, and cheesy with a yeast infection.  ? The discharge is frothy and yellow or greenish with trichomoniasis.   A bad vaginal smell. The smell is fishy with bacterial vaginosis.   Vaginal itching, pain, or swelling.   Sex that is painful.   Pain or burning when urinating.  Sometimes there are no symptoms.  How is this diagnosed?  This condition is diagnosed based on your symptoms and medical history. A physical exam, including a pelvic exam, will also be done. You may also have other tests, including:   Tests to determine the pH level (acidity or alkalinity) of your vagina.   A whiff test, to assess the odor that results when a sample of your vaginal discharge is mixed with a potassium hydroxide solution.   Tests of vaginal fluid. A sample will be examined under a microscope.  How is this treated?  Treatment varies depending on the type of vaginitis you have. Your treatment may include:   Antibiotic creams or pills to treat bacterial vaginosis and trichomoniasis.   Antifungal medicines, such as vaginal creams or suppositories, to treat a yeast infection.   Medicine to ease discomfort if you have viral vaginitis. Your sexual partner   should also be treated.   Estrogen delivered in a cream, pill, suppository, or vaginal ring to treat atrophic vaginitis. If vaginal dryness occurs, lubricants and moisturizing creams may help. You may need to avoid scented soaps, sprays, or douches.   Stopping use of a product that is causing allergic vaginitis. Then using a vaginal cream to treat the symptoms.  Follow these instructions at home:  Lifestyle   Keep your genital area clean and dry. Avoid soap, and only rinse the area with  water.   Do not douche or use tampons until your health care provider says it is okay to do so. Use sanitary pads, if needed.   Do not have sex until your health care provider approves. When you can return to sex, practice safe sex and use condoms.   Wipe from front to back. This avoids the spread of bacteria from the rectum to the vagina.  General instructions   Take over-the-counter and prescription medicines only as told by your health care provider.   If you were prescribed an antibiotic medicine, take or use it as told by your health care provider. Do not stop taking or using the antibiotic even if you start to feel better.   Keep all follow-up visits as told by your health care provider. This is important.  How is this prevented?   Use mild, non-scented products. Do not use things that can irritate the vagina, such as fabric softeners. Avoid the following products if they are scented:  ? Feminine sprays.  ? Detergents.  ? Tampons.  ? Feminine hygiene products.  ? Soaps or bubble baths.   Let air reach your genital area.  ? Wear cotton underwear to reduce moisture buildup.  ? Avoid wearing underwear while you sleep.  ? Avoid wearing tight pants and underwear or nylons without a cotton panel.  ? Avoid wearing thong underwear.   Take off any wet clothing, such as bathing suits, as soon as possible.   Practice safe sex and use condoms.  Contact a health care provider if:   You have abdominal pain.   You have a fever.   You have symptoms that last for more than 2-3 days.  Get help right away if:   You have a fever and your symptoms suddenly get worse.  Summary   Vaginitis is a condition in which the vaginal tissue becomes inflamed.This condition is most often caused by a change in the normal balance of bacteria and yeast that live in the vagina.   Treatment varies depending on the type of vaginitis you have.   Do not douche, use tampons , or have sex until your health care provider approves. When  you can return to sex, practice safe sex and use condoms.  This information is not intended to replace advice given to you by your health care provider. Make sure you discuss any questions you have with your health care provider.  Document Released: 08/18/2007 Document Revised: 11/26/2016 Document Reviewed: 11/26/2016  Elsevier Interactive Patient Education  2019 [employer]

## 2018-11-13 LAB — NUSWAB VAGINITIS PLUS (VG+)
CHLAMYDIA TRACHOMATIS, NAA: NEGATIVE
Candida albicans, NAA: NEGATIVE
Candida glabrata, NAA: NEGATIVE
Neisseria gonorrhoeae, NAA: NEGATIVE
Trich vag by NAA: NEGATIVE

## 2018-11-17 ENCOUNTER — Telehealth: Payer: Self-pay | Admitting: Obstetrics and Gynecology

## 2018-11-17 ENCOUNTER — Ambulatory Visit: Payer: BC Managed Care – PPO | Admitting: Licensed Clinical Social Worker

## 2018-11-17 NOTE — Telephone Encounter (Signed)
Spoke with patient.  She removed her nuva ring today.  Menses started today.  Changed her pad twice since waking up.  Not heavy bleeding, just wasn't sure to expect a period with Nuva Ring.  Patient states she is planning on keeping ring out for one week and placing a new ring on 11/24/2018.  Headaches are improved but not 100%.  Reminded of lab appointment as scheduled.  Discussed  Bleeding emergencies. Patient to call back or seek immediate medical care if bleeding worsens or soaking through 1 pad/tampon per hour for two hours or if becomes symptomatic with sob, chest pain, fatigued, lightheaded, or weakness.  Patient verbalized understanding. Will call back if any increase in symptoms or new concerns.  Encounter to Dr. Quincy Simmonds.  Okay to close?

## 2018-11-17 NOTE — Telephone Encounter (Signed)
Patient began using nuvaring 10/27/18 and is having bleeding. Wanted to know if this is normal.

## 2018-11-18 ENCOUNTER — Other Ambulatory Visit: Payer: BC Managed Care – PPO

## 2018-11-18 NOTE — Telephone Encounter (Signed)
Encounter reviewed and closed.  

## 2018-11-23 ENCOUNTER — Ambulatory Visit (INDEPENDENT_AMBULATORY_CARE_PROVIDER_SITE_OTHER): Payer: BC Managed Care – PPO | Admitting: Licensed Clinical Social Worker

## 2018-11-23 DIAGNOSIS — F324 Major depressive disorder, single episode, in partial remission: Secondary | ICD-10-CM

## 2018-12-04 ENCOUNTER — Other Ambulatory Visit: Payer: Self-pay | Admitting: *Deleted

## 2018-12-04 ENCOUNTER — Other Ambulatory Visit (INDEPENDENT_AMBULATORY_CARE_PROVIDER_SITE_OTHER): Payer: BC Managed Care – PPO

## 2018-12-04 DIAGNOSIS — D649 Anemia, unspecified: Secondary | ICD-10-CM

## 2018-12-05 LAB — IRON: Iron: 20 ug/dL — ABNORMAL LOW (ref 27–159)

## 2018-12-05 LAB — CBC
Hematocrit: 32.4 % — ABNORMAL LOW (ref 34.0–46.6)
Hemoglobin: 10.5 g/dL — ABNORMAL LOW (ref 11.1–15.9)
MCH: 25.1 pg — ABNORMAL LOW (ref 26.6–33.0)
MCHC: 32.4 g/dL (ref 31.5–35.7)
MCV: 78 fL — ABNORMAL LOW (ref 79–97)
Platelets: 325 10*3/uL (ref 150–450)
RBC: 4.18 x10E6/uL (ref 3.77–5.28)
RDW: 14.7 % (ref 11.7–15.4)
WBC: 5.3 10*3/uL (ref 3.4–10.8)

## 2018-12-05 LAB — FERRITIN: Ferritin: 7 ng/mL — ABNORMAL LOW (ref 15–150)

## 2018-12-07 ENCOUNTER — Other Ambulatory Visit: Payer: Self-pay | Admitting: *Deleted

## 2018-12-07 DIAGNOSIS — D649 Anemia, unspecified: Secondary | ICD-10-CM

## 2018-12-10 ENCOUNTER — Telehealth: Payer: Self-pay | Admitting: Hematology

## 2018-12-10 NOTE — Telephone Encounter (Signed)
lmom for pt to return call to office re new patient appointment. appt letter mailed for appt 2/27 at 12 pm

## 2018-12-28 ENCOUNTER — Other Ambulatory Visit: Payer: Self-pay | Admitting: Hematology

## 2018-12-28 DIAGNOSIS — D509 Iron deficiency anemia, unspecified: Secondary | ICD-10-CM

## 2018-12-28 DIAGNOSIS — D500 Iron deficiency anemia secondary to blood loss (chronic): Secondary | ICD-10-CM | POA: Insufficient documentation

## 2018-12-28 NOTE — Progress Notes (Signed)
[facility] CONSULT NOTE  Patient Care Team: Patient, No Pcp Per as PCP - General (General Practice)  HEME/ONC OVERVIEW: 1. Iron deficiency anemia secondary to menorrhagia -S/p myomectomy for uterine fibroids in 2019 at [facility]:   PRN IV iron  ASSESSMENT & PLAN:   Microcytic anemia -I reviewed the patient's records in detail, including PCP and gynecology clinic notes, lab studies dating back to 2016 -In summary, patient has had chronic microcytic anemia (Hgb ~10) dating back to 2016.  Limited iron profile, including ferritin and serum iron level, has been consistently low since then.  CT abdomen/pelvis in 2016 was notable for enlarged uterus with multiple fibroids. She reports chronic menorrhagia for ~20 years and recently underwent myomectomy for uterine fibroids in 2019 at [facility]. Since then, she has had improvement in the menorrhagia. -Hgb 11.1 today; iron profile pending -Clinically, patient reports persistent fatigue associated with her anemia  -I have personally reviewed the peripheral blood smear, which showed mildly microcytic, hypochromic RBC's, consistent with iron deficiency anemia  -Patient does not eat red meat due to reported intolerance, and has not been taking iron supplement consistently -Given her long-standing history of heavy menstrual bleeding and inadequate dietary intake of iron, her iron deficiency is likely going to persist  -Therefore, we discussed some of the risks, benefits, and alternatives of intravenous iron infusions.  -The patient is symptomatic from anemia and the iron level is critically low; as such, oral supplement is not sufficient to replete iron storage quickly and she will need IV iron to higher levels of iron faster for adequate hematopoesis.  -Some of the side-effects to be expected including risks of infusion reactions, phlebitis, headaches, nausea and fatigue.   -The patient is willing to proceed.  -Goal is to keep  ferritin level greater than 50. -As her anemia is relatively mild, I will plan to administer 1 dose of IV iron for now (tentatively 01/08/2019) and re-assess her response in two months -I have also instructed the patient to start oral iron supplement daily   Menorrhagia -Improved since myomectomy for uterine fibroids in 2019 -I encouraged patient to follow up with her gynecologist as scheduled  Orders Placed This Encounter  Procedures  . CBC with Differential (Cancer Center Only)    Standing Status:   Future    Standing Expiration Date:   02/04/2020  . Save Smear (SSMR)    Standing Status:   Future    Standing Expiration Date:   01/01/2020  . Ferritin    Standing Status:   Future    Standing Expiration Date:   02/04/2020  . Iron and TIBC    Standing Status:   Future    Standing Expiration Date:   02/04/2020   All questions were answered. The patient knows to call the clinic with any problems, questions or concerns.  Return in 2 months for labs and clinic follow-up.   Tish Men, MD 12/31/2018 3:01 PM   CHIEF COMPLAINTS/PURPOSE OF CONSULTATION:  "My red blood cell count is low"  HISTORY OF PRESENTING ILLNESS:  Allison Bentley 33 y.o. female is here because of persistent microcytic anemia.  Ms. Scronce has had chronic microcytic anemia dating back to 2016, with Hgb between 10 and 11.  She reports menorrhagia dating back to age 25.  Her gynecologist found multiple uterine fibroids, likely the source of her menorrhagia, and she underwent laparoscopic myomectomy in 2019 at [facility].  She reports that since the procedure, her menstrual cycle has  improved significantly.  It is regular, every 25 days, heavy only during the first 2 days, lasting 5 to 6 days total.  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  She has taken oral iron supplement periodically, but does not take it regularly.  She denies any significant GI side effects on oral  iron supplement.  She eats mostly fish and chicken, but does not eat any red meat, such as beef, due to reported history of food intolerance.  She denies any family history of anemia, such as sickle cell anemia or thalassemia.  She denies any other complaint today.  MEDICAL HISTORY:  Past Medical History:  Diagnosis Date  . Dysmenorrhea   . Eczema    arms and neck  . Fibroid   . Heart murmur    dx'd as a teenager and told would outgrow  . 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  Marland Kitchen Hormone disorder    enlarged thyroid  . Psoriasis of scalp   . Seasonal allergies   . STD (sexually transmitted disease)    Tx'd for Chlamydia and Gonorrhea age 82    SURGICAL HISTORY: Past Surgical History:  Procedure Laterality Date  . EYE SURGERY     to remove styes at age 32  . MYOMECTOMY  05/21/2017   laparoscopic - labor and vaginal delivery not recommended by [facility]  . wisdom teeth Bilateral     SOCIAL HISTORY: Social History   Socioeconomic History  . Marital status: Single    Spouse name: Not on file  . Number of children: Not on file  . Years of education: Not on file  . Highest education level: Not on file  Occupational History  . Not on file  Social Needs  . Financial resource strain: Not on file  . Food insecurity:    Worry: Not on file    Inability: Not on file  . Transportation needs:    Medical: Not on file    Non-medical: Not on file  Tobacco Use  . Smoking status: Never Smoker  . Smokeless tobacco: Never Used  Substance and Sexual Activity  . Alcohol use: Yes    Alcohol/week: 0.0 standard drinks    Comment: once every few months  . Drug use: No  . Sexual activity: Not Currently    Partners: Male    Birth control/protection: None  Lifestyle  . Physical activity:    Days per week: Not on file    Minutes per session: Not on file  . Stress: Not on file  Relationships  . Social connections:    Talks on phone: Not on file    Gets together: Not on file    Attends  religious service: Not on file    Active member of club or organization: Not on file    Attends meetings of clubs or organizations: Not on file    Relationship status: Not on file  . Intimate partner violence:    Fear of current or ex partner: Not on file    Emotionally abused: Not on file    Physically abused: Not on file    Forced sexual activity: Not on file  Other Topics Concern  . Not on file  Social History Narrative  . Not on file    FAMILY HISTORY: Family History  Problem Relation Age of Onset  . Hypertension Mother   . Thyroid disease Mother   . Diabetes Father   .  Hypertension Father   . Diabetes Maternal Grandmother   . Hypertension Maternal Grandmother   . Stroke Maternal Grandmother   . Diabetes Maternal Grandfather   . Hypertension Maternal Grandfather   . Hypertension Paternal Grandmother   . Hypertension Paternal Grandfather     ALLERGIES:  is allergic to acyclovir and related; ampicillin; pollen extract; and valtrex [valacyclovir].  MEDICATIONS:  Current Outpatient Medications  Medication Sig Dispense Refill  . acetaminophen (TYLENOL) 500 MG chewable tablet Chew 500 mg by mouth every 6 (six) hours as needed for pain.    . clobetasol ointment (TEMOVATE) 0.86 % Apply 1 application topically as needed.     . etonogestrel-ethinyl estradiol (NUVARING) 0.12-0.015 MG/24HR vaginal ring Place 1 each vaginally every 28 (twenty-eight) days. Place a new ring every 28 days. 3 each 3  . famciclovir (FAMVIR) 500 MG tablet Take two tablets (1000 mg) by mouth twice a day for one day as needed. 30 tablet 1  . metroNIDAZOLE (FLAGYL) 500 MG tablet Take 1 tablet (500 mg total) by mouth 2 (two) times daily. 14 tablet 0   No current facility-administered medications for this visit.     REVIEW OF SYSTEMS:   Constitutional: ( - ) fevers, ( - )  chills , ( - ) night sweats Eyes: ( - ) blurriness of vision, ( - ) double vision, ( - ) watery eyes Ears, nose, mouth, throat, and  face: ( - ) mucositis, ( - ) sore throat Respiratory: ( - ) cough, ( - ) dyspnea, ( - ) wheezes Cardiovascular: ( - ) palpitation, ( - ) chest discomfort, ( - ) lower extremity swelling Gastrointestinal:  ( - ) nausea, ( - ) heartburn, ( - ) change in bowel habits Skin: ( - ) abnormal skin rashes Lymphatics: ( - ) new lymphadenopathy, ( - ) easy bruising Neurological: ( - ) numbness, ( - ) tingling, ( - ) new weaknesses Behavioral/Psych: ( - ) mood change, ( - ) new changes  All other systems were reviewed with the patient and are negative.  PHYSICAL EXAMINATION: ECOG PERFORMANCE STATUS: 1 - Symptomatic but completely ambulatory  Vitals:   12/31/18 1222  BP: 129/87  Pulse: 70  Resp: 18  Temp: 98.9 F (37.2 C)  SpO2: 100%   Filed Weights   12/31/18 1222  Weight: 187 lb (84.8 kg)    GENERAL: alert, no distress and comfortable SKIN: skin color, texture, turgor are normal, no rashes or significant lesions EYES: conjunctiva are pink and non-injected, sclera clear OROPHARYNX: no exudate, no erythema; lips, buccal mucosa, and tongue normal  NECK: supple, non-tender LYMPH:  no palpable lymphadenopathy in the cervical LUNGS: clear to auscultation with normal breathing effort HEART: regular rate & rhythm, no murmurs, no lower extremity edema ABDOMEN: soft, non-tender, non-distended, normal bowel sounds Musculoskeletal: no cyanosis of digits and no clubbing  PSYCH: alert & oriented x 3, fluent speech NEURO: no focal motor/sensory deficits  LABORATORY DATA:  I have reviewed the data as listed Lab Results  Component Value Date   WBC 6.3 12/31/2018   HGB 11.1 (L) 12/31/2018   HCT 35.5 (L) 12/31/2018   MCV 79.2 (L) 12/31/2018   PLT 367 12/31/2018   Lab Results  Component Value Date   NA 139 12/31/2018   K 3.8 12/31/2018   CL 107 12/31/2018   CO2 25 12/31/2018   PATHOLOGY: I personally reviewed the patient's peripheral blood smear today.  The red blood cells were slightly  microcytic and hypochromic.  There was no schistocytosis.  The white blood cells were of normal morphology. There were no peripheral circulating blasts. The platelets were of normal size and I verified that there were no platelet clumping.

## 2018-12-31 ENCOUNTER — Inpatient Hospital Stay: Payer: BC Managed Care – PPO

## 2018-12-31 ENCOUNTER — Other Ambulatory Visit: Payer: Self-pay

## 2018-12-31 ENCOUNTER — Ambulatory Visit (INDEPENDENT_AMBULATORY_CARE_PROVIDER_SITE_OTHER): Payer: BC Managed Care – PPO | Admitting: Licensed Clinical Social Worker

## 2018-12-31 ENCOUNTER — Inpatient Hospital Stay: Payer: BC Managed Care – PPO | Attending: Hematology | Admitting: Hematology

## 2018-12-31 ENCOUNTER — Encounter: Payer: Self-pay | Admitting: Hematology

## 2018-12-31 VITALS — BP 129/87 | HR 70 | Temp 98.9°F | Resp 18 | Ht 62.0 in | Wt 187.0 lb

## 2018-12-31 DIAGNOSIS — D509 Iron deficiency anemia, unspecified: Secondary | ICD-10-CM

## 2018-12-31 DIAGNOSIS — N920 Excessive and frequent menstruation with regular cycle: Secondary | ICD-10-CM | POA: Diagnosis not present

## 2018-12-31 DIAGNOSIS — F324 Major depressive disorder, single episode, in partial remission: Secondary | ICD-10-CM

## 2018-12-31 LAB — CBC WITH DIFFERENTIAL (CANCER CENTER ONLY)
Abs Immature Granulocytes: 0.01 10*3/uL (ref 0.00–0.07)
Basophils Absolute: 0 10*3/uL (ref 0.0–0.1)
Basophils Relative: 1 %
Eosinophils Absolute: 0.1 10*3/uL (ref 0.0–0.5)
Eosinophils Relative: 2 %
HCT: 35.5 % — ABNORMAL LOW (ref 36.0–46.0)
Hemoglobin: 11.1 g/dL — ABNORMAL LOW (ref 12.0–15.0)
Immature Granulocytes: 0 %
Lymphocytes Relative: 35 %
Lymphs Abs: 2.2 10*3/uL (ref 0.7–4.0)
MCH: 24.8 pg — ABNORMAL LOW (ref 26.0–34.0)
MCHC: 31.3 g/dL (ref 30.0–36.0)
MCV: 79.2 fL — ABNORMAL LOW (ref 80.0–100.0)
Monocytes Absolute: 0.5 10*3/uL (ref 0.1–1.0)
Monocytes Relative: 9 %
Neutro Abs: 3.4 10*3/uL (ref 1.7–7.7)
Neutrophils Relative %: 53 %
Platelet Count: 367 10*3/uL (ref 150–400)
RBC: 4.48 MIL/uL (ref 3.87–5.11)
RDW: 14.9 % (ref 11.5–15.5)
WBC Count: 6.3 10*3/uL (ref 4.0–10.5)
nRBC: 0 % (ref 0.0–0.2)

## 2018-12-31 LAB — CMP (CANCER CENTER ONLY)
ALT: 15 U/L (ref 0–44)
AST: 19 U/L (ref 15–41)
Albumin: 4.4 g/dL (ref 3.5–5.0)
Alkaline Phosphatase: 49 U/L (ref 38–126)
Anion gap: 7 (ref 5–15)
BUN: 12 mg/dL (ref 6–20)
CO2: 25 mmol/L (ref 22–32)
Calcium: 9.4 mg/dL (ref 8.9–10.3)
Chloride: 107 mmol/L (ref 98–111)
Creatinine: 0.84 mg/dL (ref 0.44–1.00)
GFR, Est AFR Am: >60 mL/min (ref >60)
Glucose, Bld: 68 mg/dL — ABNORMAL LOW (ref 70–99)
Potassium: 3.8 mmol/L (ref 3.5–5.1)
Sodium: 139 mmol/L (ref 135–145)
TOTAL PROTEIN: 8 g/dL (ref 6.5–8.1)
Total Bilirubin: 0.4 mg/dL (ref 0.3–1.2)

## 2018-12-31 LAB — SAVE SMEAR(SSMR), FOR PROVIDER SLIDE REVIEW

## 2019-01-01 LAB — IRON AND TIBC
Iron: 50 ug/dL (ref 41–142)
Saturation Ratios: 12 % — ABNORMAL LOW (ref 21–57)
TIBC: 404 ug/dL (ref 236–444)
UIBC: 354 ug/dL (ref 120–384)

## 2019-01-01 LAB — FERRITIN: Ferritin: <4 ng/mL — ABNORMAL LOW (ref 11–307)

## 2019-01-04 LAB — HEMOGLOBINOPATHY EVALUATION
HGB A2 QUANT: 1.8 % (ref 1.8–3.2)
HGB F QUANT: 0 % (ref 0.0–2.0)
Hgb A: 98.2 % (ref 96.4–98.8)
Hgb C: 0 %
Hgb S Quant: 0 %
Hgb Variant: 0 %

## 2019-01-08 ENCOUNTER — Inpatient Hospital Stay: Payer: BC Managed Care – PPO

## 2019-01-15 ENCOUNTER — Other Ambulatory Visit: Payer: Self-pay

## 2019-01-15 ENCOUNTER — Inpatient Hospital Stay: Payer: BC Managed Care – PPO | Attending: Hematology

## 2019-01-15 VITALS — BP 141/99 | HR 68 | Temp 98.7°F | Resp 20

## 2019-01-15 DIAGNOSIS — D509 Iron deficiency anemia, unspecified: Secondary | ICD-10-CM | POA: Diagnosis not present

## 2019-01-15 DIAGNOSIS — Z79899 Other long term (current) drug therapy: Secondary | ICD-10-CM | POA: Diagnosis not present

## 2019-01-15 MED ORDER — SODIUM CHLORIDE 0.9 % IV SOLN
Freq: Once | INTRAVENOUS | Status: AC
  Administered 2019-01-15: 15:00:00 via INTRAVENOUS
  Filled 2019-01-15: qty 250

## 2019-01-15 MED ORDER — SODIUM CHLORIDE 0.9 % IV SOLN
510.0000 mg | Freq: Once | INTRAVENOUS | Status: AC
  Administered 2019-01-15: 510 mg via INTRAVENOUS
  Filled 2019-01-15: qty 17

## 2019-01-15 NOTE — Patient Instructions (Signed)

## 2019-02-02 ENCOUNTER — Ambulatory Visit (INDEPENDENT_AMBULATORY_CARE_PROVIDER_SITE_OTHER): Payer: BC Managed Care – PPO | Admitting: Licensed Clinical Social Worker

## 2019-02-02 DIAGNOSIS — F324 Major depressive disorder, single episode, in partial remission: Secondary | ICD-10-CM

## 2019-02-11 ENCOUNTER — Ambulatory Visit: Payer: BC Managed Care – PPO | Admitting: Licensed Clinical Social Worker

## 2019-03-02 ENCOUNTER — Telehealth: Payer: Self-pay | Admitting: Hematology

## 2019-03-02 NOTE — Telephone Encounter (Signed)
Called and LMVM for patient regarding appointment time change per 4/27 staff message from [facility]

## 2019-03-04 ENCOUNTER — Ambulatory Visit (INDEPENDENT_AMBULATORY_CARE_PROVIDER_SITE_OTHER): Payer: BC Managed Care – PPO | Admitting: Licensed Clinical Social Worker

## 2019-03-04 DIAGNOSIS — F324 Major depressive disorder, single episode, in partial remission: Secondary | ICD-10-CM

## 2019-03-05 ENCOUNTER — Encounter: Payer: Self-pay | Admitting: Hematology

## 2019-03-05 ENCOUNTER — Ambulatory Visit: Payer: Self-pay | Admitting: Hematology

## 2019-03-05 ENCOUNTER — Other Ambulatory Visit: Payer: Self-pay

## 2019-03-05 ENCOUNTER — Inpatient Hospital Stay: Payer: BC Managed Care – PPO | Attending: Hematology

## 2019-03-05 ENCOUNTER — Inpatient Hospital Stay (HOSPITAL_BASED_OUTPATIENT_CLINIC_OR_DEPARTMENT_OTHER): Payer: BC Managed Care – PPO | Admitting: Hematology

## 2019-03-05 VITALS — BP 125/80 | HR 66 | Resp 18 | Ht 62.0 in | Wt 190.0 lb

## 2019-03-05 DIAGNOSIS — Z79899 Other long term (current) drug therapy: Secondary | ICD-10-CM | POA: Insufficient documentation

## 2019-03-05 DIAGNOSIS — N920 Excessive and frequent menstruation with regular cycle: Secondary | ICD-10-CM

## 2019-03-05 DIAGNOSIS — D509 Iron deficiency anemia, unspecified: Secondary | ICD-10-CM

## 2019-03-05 DIAGNOSIS — D500 Iron deficiency anemia secondary to blood loss (chronic): Secondary | ICD-10-CM | POA: Insufficient documentation

## 2019-03-05 LAB — CBC WITH DIFFERENTIAL (CANCER CENTER ONLY)
Abs Immature Granulocytes: 0.02 10*3/uL (ref 0.00–0.07)
Basophils Absolute: 0 10*3/uL (ref 0.0–0.1)
Basophils Relative: 1 %
Eosinophils Absolute: 0.3 10*3/uL (ref 0.0–0.5)
Eosinophils Relative: 6 %
HCT: 38.5 % (ref 36.0–46.0)
Hemoglobin: 12.4 g/dL (ref 12.0–15.0)
Immature Granulocytes: 0 %
Lymphocytes Relative: 33 %
Lymphs Abs: 1.7 10*3/uL (ref 0.7–4.0)
MCH: 27.3 pg (ref 26.0–34.0)
MCHC: 32.2 g/dL (ref 30.0–36.0)
MCV: 84.8 fL (ref 80.0–100.0)
Monocytes Absolute: 0.4 10*3/uL (ref 0.1–1.0)
Monocytes Relative: 8 %
Neutro Abs: 2.7 10*3/uL (ref 1.7–7.7)
Neutrophils Relative %: 52 %
Platelet Count: 295 10*3/uL (ref 150–400)
RBC: 4.54 MIL/uL (ref 3.87–5.11)
RDW: 17.9 % — ABNORMAL HIGH (ref 11.5–15.5)
WBC Count: 5.2 10*3/uL (ref 4.0–10.5)
nRBC: 0 % (ref 0.0–0.2)

## 2019-03-05 LAB — SAVE SMEAR(SSMR), FOR PROVIDER SLIDE REVIEW

## 2019-03-05 LAB — IRON AND TIBC
Iron: 64 ug/dL (ref 41–142)
Saturation Ratios: 23 % (ref 21–57)
TIBC: 281 ug/dL (ref 236–444)
UIBC: 217 ug/dL (ref 120–384)

## 2019-03-05 LAB — FERRITIN: Ferritin: 34 ng/mL (ref 11–307)

## 2019-03-05 NOTE — Progress Notes (Signed)
[facility] OFFICE PROGRESS NOTE  Patient Care Team: Patient, No Pcp Per as PCP - General (General Practice)  HEME/ONC OVERVIEW: 1. Iron deficiency anemia secondary to menorrhagia -S/p myomectomy for uterine fibroids in 2019 at [facility]:   PRN IV iron, last in 01/2019   ASSESSMENT & PLAN:   Iron deficiency anemia -Secondary to chronic blood loss from menorrhagia -Patient reports that since myomectomy for uterine fibroids in 2019 and being on oral contraceptives, her menorrhagia has improved over time, but she still has somewhat heavy bleeding during the first 2 days of the menstrual cycle -She is tolerating oral iron supplement without any significant side effects -Hgb 12.4 today, improving since 12/2018; iron profile pending -I personally reviewed the patient's peripheral blood smear today, which showed mostly normal sized RBCs with occasional hypochromic RBCs and some elliptocytes; there was no schistocytosis -Given the normalization of Hgb, there is no indication for IV iron today -I discussed with the patient regarding different dietary options that may increase her intake of iron, including red meat -Continue oral iron supplement daily  Menorrhagia -Improved since myomectomy for uterine fibroids in 2019; currently on oral contraceptives -I explained to the patient that until her menorrhagia improves significantly, she may develop periodic iron deficiency anemia that may need IV iron infusion -I encouraged patient to continue close follow-up with her gynecologist as scheduled  Orders Placed This Encounter  Procedures  . CBC with Differential (Cancer Center Only)    Standing Status:   Future    Standing Expiration Date:   04/08/2020  . Save Smear (SSMR)    Standing Status:   Future    Standing Expiration Date:   03/04/2020  . Ferritin    Standing Status:   Future    Standing Expiration Date:   04/08/2020  . Iron and TIBC    Standing Status:    Future    Standing Expiration Date:   04/08/2020   All questions were answered. The patient knows to call the clinic with any problems, questions or concerns. No barriers to learning was detected.  A total of more than 15 minutes were spent face-to-face with the patient during this encounter and over half of that time was spent on counseling and coordination of care as outlined above.   Return in 4 months for labs and clinic follow-up.  Tish Men, MD 03/05/2019 9:50 AM  CHIEF COMPLAINT: "I am doing a little better"  INTERVAL HISTORY: Ms. Mackowski returns to clinic for follow-up of iron deficiency anemia secondary to menorrhagia.  Patient reports that since the IV iron infusion, her energy level has improved.  She denied any allergic reaction to the IV iron infusion.  Her menstrual cycle is regular and still somewhat heavy during the first 2 days of the menstrual cycle, but overall, he has improved significantly since myomectomy in 2019 and being on oral contraceptives.  She is tolerating daily oral iron without any significant side effects, such as constipation or GI upset.  She eats a relatively balanced diet, including chicken fish, but she does not eat a lot of red meat.  She denies any other complaint today.   REVIEW OF SYSTEMS:   Constitutional: ( - ) fevers, ( - )  chills , ( - ) night sweats Eyes: ( - ) blurriness of vision, ( - ) double vision, ( - ) watery eyes Ears, nose, mouth, throat, and face: ( - ) mucositis, ( - ) sore throat Respiratory: ( - )  cough, ( - ) dyspnea, ( - ) wheezes Cardiovascular: ( - ) palpitation, ( - ) chest discomfort, ( - ) lower extremity swelling Gastrointestinal:  ( - ) nausea, ( - ) heartburn, ( - ) change in bowel habits Skin: ( - ) abnormal skin rashes Lymphatics: ( - ) new lymphadenopathy, ( - ) easy bruising Neurological: ( - ) numbness, ( - ) tingling, ( - ) new weaknesses Behavioral/Psych: ( - ) mood change, ( - ) new changes  All other systems  were reviewed with the patient and are negative.  I have reviewed the past medical history, past surgical history, social history and family history with the patient and they are unchanged from previous note.  ALLERGIES:  is allergic to acyclovir and related; ampicillin; pollen extract; and valtrex [valacyclovir].  MEDICATIONS:  Current Outpatient Medications  Medication Sig Dispense Refill  . acetaminophen (TYLENOL) 500 MG chewable tablet Chew 500 mg by mouth every 6 (six) hours as needed for pain.    . clobetasol ointment (TEMOVATE) 5.10 % Apply 1 application topically as needed.     . etonogestrel-ethinyl estradiol (NUVARING) 0.12-0.015 MG/24HR vaginal ring Place 1 each vaginally every 28 (twenty-eight) days. Place a new ring every 28 days. 3 each 3  . famciclovir (FAMVIR) 500 MG tablet Take two tablets (1000 mg) by mouth twice a day for one day as needed. 30 tablet 1  . metroNIDAZOLE (FLAGYL) 500 MG tablet Take 1 tablet (500 mg total) by mouth 2 (two) times daily. 14 tablet 0   No current facility-administered medications for this visit.     PHYSICAL EXAMINATION: ECOG PERFORMANCE STATUS: 1 - Symptomatic but completely ambulatory  Today's Vitals   03/05/19 0920  BP: 125/80  Pulse: 66  Resp: 18  SpO2: 98%  Weight: 190 lb (86.2 kg)  Height: 5\' 2"  (1.575 m)  PainSc: 0-No pain   Body mass index is 34.75 kg/m.  Filed Weights   03/05/19 0920  Weight: 190 lb (86.2 kg)    GENERAL: alert, no distress and comfortable SKIN: skin color, texture, turgor are normal, no rashes or significant lesions EYES: conjunctiva are pink and non-injected, sclera clear OROPHARYNX: no exudate, no erythema; lips, buccal mucosa, and tongue normal  NECK: supple, non-tender LUNGS: clear to auscultation with normal breathing effort HEART: regular rate & rhythm and no murmurs and no lower extremity edema ABDOMEN: soft, non-tender, non-distended, normal bowel sounds Musculoskeletal: no cyanosis of  digits and no clubbing  PSYCH: alert & oriented x 3, fluent speech NEURO: no focal motor/sensory deficits  LABORATORY DATA:  I have reviewed the data as listed    Component Value Date/Time   NA 139 12/31/2018 1157   NA 141 09/14/2018 1606   K 3.8 12/31/2018 1157   CL 107 12/31/2018 1157   CO2 25 12/31/2018 1157   GLUCOSE 68 (L) 12/31/2018 1157   BUN 12 12/31/2018 1157   BUN 9 09/14/2018 1606   CREATININE 0.84 12/31/2018 1157   CREATININE 0.79 08/05/2016 1312   CALCIUM 9.4 12/31/2018 1157   PROT 8.0 12/31/2018 1157   PROT 7.2 09/14/2018 1606   ALBUMIN 4.4 12/31/2018 1157   ALBUMIN 4.3 09/14/2018 1606   AST 19 12/31/2018 1157   ALT 15 12/31/2018 1157   ALKPHOS 49 12/31/2018 1157   BILITOT 0.4 12/31/2018 1157   GFRNONAA >60 12/31/2018 1157   GFRAA >60 12/31/2018 1157    No results found for: SPEP, UPEP  Lab Results  Component Value Date  WBC 5.2 03/05/2019   NEUTROABS 2.7 03/05/2019   HGB 12.4 03/05/2019   HCT 38.5 03/05/2019   MCV 84.8 03/05/2019   PLT 295 03/05/2019      Chemistry      Component Value Date/Time   NA 139 12/31/2018 1157   NA 141 09/14/2018 1606   K 3.8 12/31/2018 1157   CL 107 12/31/2018 1157   CO2 25 12/31/2018 1157   BUN 12 12/31/2018 1157   BUN 9 09/14/2018 1606   CREATININE 0.84 12/31/2018 1157   CREATININE 0.79 08/05/2016 1312      Component Value Date/Time   CALCIUM 9.4 12/31/2018 1157   ALKPHOS 49 12/31/2018 1157   AST 19 12/31/2018 1157   ALT 15 12/31/2018 1157   BILITOT 0.4 12/31/2018 1157

## 2019-04-01 ENCOUNTER — Ambulatory Visit (INDEPENDENT_AMBULATORY_CARE_PROVIDER_SITE_OTHER): Payer: BC Managed Care – PPO | Admitting: Licensed Clinical Social Worker

## 2019-04-01 DIAGNOSIS — F324 Major depressive disorder, single episode, in partial remission: Secondary | ICD-10-CM | POA: Diagnosis not present

## 2019-04-28 ENCOUNTER — Ambulatory Visit (INDEPENDENT_AMBULATORY_CARE_PROVIDER_SITE_OTHER): Payer: BC Managed Care – PPO | Admitting: Licensed Clinical Social Worker

## 2019-04-28 DIAGNOSIS — F324 Major depressive disorder, single episode, in partial remission: Secondary | ICD-10-CM | POA: Diagnosis not present

## 2019-05-26 ENCOUNTER — Ambulatory Visit (INDEPENDENT_AMBULATORY_CARE_PROVIDER_SITE_OTHER): Payer: BC Managed Care – PPO | Admitting: Licensed Clinical Social Worker

## 2019-05-26 DIAGNOSIS — F324 Major depressive disorder, single episode, in partial remission: Secondary | ICD-10-CM | POA: Diagnosis not present

## 2019-06-18 ENCOUNTER — Ambulatory Visit: Payer: BC Managed Care – PPO | Admitting: Certified Nurse Midwife

## 2019-06-18 ENCOUNTER — Encounter: Payer: Self-pay | Admitting: Certified Nurse Midwife

## 2019-06-18 ENCOUNTER — Telehealth: Payer: Self-pay | Admitting: Obstetrics and Gynecology

## 2019-06-18 ENCOUNTER — Other Ambulatory Visit: Payer: Self-pay

## 2019-06-18 VITALS — BP 110/80 | HR 68 | Temp 97.4°F | Resp 16 | Wt 188.0 lb

## 2019-06-18 DIAGNOSIS — N898 Other specified noninflammatory disorders of vagina: Secondary | ICD-10-CM

## 2019-06-18 DIAGNOSIS — Z113 Encounter for screening for infections with a predominantly sexual mode of transmission: Secondary | ICD-10-CM | POA: Diagnosis not present

## 2019-06-18 NOTE — Telephone Encounter (Signed)
Patient is calling regarding vaginal discharge. Patient stated that it started on Wednesday, 06/16/2019. Patient denies itching or burning.

## 2019-06-18 NOTE — Progress Notes (Signed)
33 y.o. Single African American female G0P0 here with complaint of vaginal symptoms of increase odorous discharge. Describes discharge as white with slight tint green. Onset of symptoms 3 days ago.Denies pelvic pain, fever, chills or headache.. Period was 21 day cycle this last time, but normal. Denies new personal products. Sexually active, and would like STD screening, vaginal and blood work. Urinary symptoms none . Contraception is Nuvaring. No other issues today.  Review of Systems  Constitutional: Negative.   HENT: Negative.   Eyes: Negative.   Respiratory: Negative.   Cardiovascular: Negative.   Gastrointestinal: Negative.   Genitourinary: Negative.   Musculoskeletal: Negative.   Skin:       Vaginal discharge & odor  Neurological: Negative.   Endo/Heme/Allergies: Negative.   Psychiatric/Behavioral: Negative.     O:Healthy female WDWN Affect: normal, orientation x 3  Exam: Skin warm and dry Abdomen:soft, non tender, no rebound  Inguinal Lymph nodes: no enlargement or tenderness Pelvic exam: External genital: normal female BUS: negative Vagina: watery appearing very slight odorous discharge noted. Affirm taken, Gc/chlamydia takenCervix: normal, non tender, no CMT Uterus: normal, non tender Adnexa:normal, non tender, no masses or fullness noted   Wet Prep results:   A:Normal pelvic exam   P:Discussed findings of increase discharge, very slight odor. Will wait for lab to treat correctly.  Discussed Aveeno or baking soda sitz bath for comfort. Instructions given. Warning signs with vaginal discharge discussed. Discussed consistent condom use for STD prevention. Questions addressed. Lab: Affirm, Gc/Chlamydia, HIV, RPR. Patient will be called with results when in.  Rv prn

## 2019-06-18 NOTE — Telephone Encounter (Signed)
Call to patient. Patient states LMP was 8/5 to 8/10. Patient states on Wednesday, she started to notice a "greyish discharge" with a "not pleasant" odor. Patient denies itching or irritation. States she has been with her new partner for "a few weeks." Nuvaring for contraception. Denies fever, but states she is having some pelvic pain, but thinks it may be related to constipation. OV recommended. Advised Dr. Quincy Simmonds out of office, but could schedule with covering provider. Patient states she has seen Debbi in the past. Patient requesting appointment after 12pm. Patient scheduled for 06-18-2019 at 1500 with Debbi. Patient agreeable to date and time of appointment. CPS negative and patient advised to wear an un-vented mask.   Routing to provider and will close encounter.

## 2019-06-18 NOTE — Patient Instructions (Signed)
Vaginitis Vaginitis is a condition in which the vaginal tissue swells and becomes red (inflamed). This condition is most often caused by a change in the normal balance of bacteria and yeast that live in the vagina. This change causes an overgrowth of certain bacteria or yeast, which causes the inflammation. There are different types of vaginitis, but the most common types are:  Bacterial vaginosis.  Yeast infection (candidiasis).  Trichomoniasis vaginitis. This is a sexually transmitted disease (STD).  Viral vaginitis.  Atrophic vaginitis.  Allergic vaginitis. What are the causes? The cause of this condition depends on the type of vaginitis. It can be caused by:  Bacteria (bacterial vaginosis).  Yeast, which is a fungus (yeast infection).  A parasite (trichomoniasis vaginitis).  A virus (viral vaginitis).  Low hormone levels (atrophic vaginitis). Low hormone levels can occur during pregnancy, breastfeeding, or after menopause.  Irritants, such as bubble baths, scented tampons, and feminine sprays (allergic vaginitis). Other factors can change the normal balance of the yeast and bacteria that live in the vagina. These include:  Antibiotic medicines.  Poor hygiene.  Diaphragms, vaginal sponges, spermicides, birth control pills, and intrauterine devices (IUD).  Sex.  Infection.  Uncontrolled diabetes.  A weakened defense (immune) system. What increases the risk? This condition is more likely to develop in women who:  Smoke.  Use vaginal douches, scented tampons, or scented sanitary pads.  Wear tight-fitting pants.  Wear thong underwear.  Use oral birth control pills or an IUD.  Have sex without a condom.  Have multiple sex partners.  Have an STD.  Frequently use the spermicide nonoxynol-9.  Eat lots of foods high in sugar.  Have uncontrolled diabetes.  Have low estrogen levels.  Have a weakened immune system from an immune disorder or medical  treatment.  Are pregnant or breastfeeding. What are the signs or symptoms? Symptoms vary depending on the cause of the vaginitis. Common symptoms include:  Abnormal vaginal discharge. ? The discharge is white, gray, or yellow with bacterial vaginosis. ? The discharge is thick, white, and cheesy with a yeast infection. ? The discharge is frothy and yellow or greenish with trichomoniasis.  A bad vaginal smell. The smell is fishy with bacterial vaginosis.  Vaginal itching, pain, or swelling.  Sex that is painful.  Pain or burning when urinating. Sometimes there are no symptoms. How is this diagnosed? This condition is diagnosed based on your symptoms and medical history. A physical exam, including a pelvic exam, will also be done. You may also have other tests, including:  Tests to determine the pH level (acidity or alkalinity) of your vagina.  A whiff test, to assess the odor that results when a sample of your vaginal discharge is mixed with a potassium hydroxide solution.  Tests of vaginal fluid. A sample will be examined under a microscope. How is this treated? Treatment varies depending on the type of vaginitis you have. Your treatment may include:  Antibiotic creams or pills to treat bacterial vaginosis and trichomoniasis.  Antifungal medicines, such as vaginal creams or suppositories, to treat a yeast infection.  Medicine to ease discomfort if you have viral vaginitis. Your sexual partner should also be treated.  Estrogen delivered in a cream, pill, suppository, or vaginal ring to treat atrophic vaginitis. If vaginal dryness occurs, lubricants and moisturizing creams may help. You may need to avoid scented soaps, sprays, or douches.  Stopping use of a product that is causing allergic vaginitis. Then using a vaginal cream to treat the symptoms. Follow   these instructions at home: Lifestyle  Keep your genital area clean and dry. Avoid soap, and only rinse the area with  water.  Do not douche or use tampons until your health care provider says it is okay to do so. Use sanitary pads, if needed.  Do not have sex until your health care provider approves. When you can return to sex, practice safe sex and use condoms.  Wipe from front to back. This avoids the spread of bacteria from the rectum to the vagina. General instructions  Take over-the-counter and prescription medicines only as told by your health care provider.  If you were prescribed an antibiotic medicine, take or use it as told by your health care provider. Do not stop taking or using the antibiotic even if you start to feel better.  Keep all follow-up visits as told by your health care provider. This is important. How is this prevented?  Use mild, non-scented products. Do not use things that can irritate the vagina, such as fabric softeners. Avoid the following products if they are scented: ? Feminine sprays. ? Detergents. ? Tampons. ? Feminine hygiene products. ? Soaps or bubble baths.  Let air reach your genital area. ? Wear cotton underwear to reduce moisture buildup. ? Avoid wearing underwear while you sleep. ? Avoid wearing tight pants and underwear or nylons without a cotton panel. ? Avoid wearing thong underwear.  Take off any wet clothing, such as bathing suits, as soon as possible.  Practice safe sex and use condoms. Contact a health care provider if:  You have abdominal pain.  You have a fever.  You have symptoms that last for more than 2-3 days. Get help right away if:  You have a fever and your symptoms suddenly get worse. Summary  Vaginitis is a condition in which the vaginal tissue becomes inflamed.This condition is most often caused by a change in the normal balance of bacteria and yeast that live in the vagina.  Treatment varies depending on the type of vaginitis you have.  Do not douche, use tampons , or have sex until your health care provider approves. When  you can return to sex, practice safe sex and use condoms. This information is not intended to replace advice given to you by your health care provider. Make sure you discuss any questions you have with your health care provider. Document Released: 08/18/2007 Document Revised: 10/03/2017 Document Reviewed: 11/26/2016 Elsevier Patient Education  2020 Elsevier Inc.  

## 2019-06-19 LAB — VAGINITIS/VAGINOSIS, DNA PROBE
Candida Species: NEGATIVE
Gardnerella vaginalis: POSITIVE — AB
Trichomonas vaginosis: NEGATIVE

## 2019-06-20 ENCOUNTER — Other Ambulatory Visit: Payer: Self-pay | Admitting: Certified Nurse Midwife

## 2019-06-20 DIAGNOSIS — B9689 Other specified bacterial agents as the cause of diseases classified elsewhere: Secondary | ICD-10-CM

## 2019-06-20 DIAGNOSIS — N760 Acute vaginitis: Secondary | ICD-10-CM

## 2019-06-20 LAB — HIV ANTIBODY (ROUTINE TESTING W REFLEX): HIV Screen 4th Generation wRfx: NONREACTIVE

## 2019-06-20 LAB — GC/CHLAMYDIA PROBE AMP
Chlamydia trachomatis, NAA: NEGATIVE
Neisseria Gonorrhoeae by PCR: NEGATIVE

## 2019-06-20 LAB — RPR: RPR Ser Ql: NONREACTIVE

## 2019-06-20 LAB — HEPATITIS C ANTIBODY: Hep C Virus Ab: 0.1 s/co ratio (ref 0.0–0.9)

## 2019-06-20 MED ORDER — METRONIDAZOLE 500 MG PO TABS: 500.0000 mg | ORAL_TABLET | Freq: Two times a day (BID) | ORAL | 0 refills | Status: DC

## 2019-06-23 ENCOUNTER — Ambulatory Visit (INDEPENDENT_AMBULATORY_CARE_PROVIDER_SITE_OTHER): Payer: BC Managed Care – PPO | Admitting: Licensed Clinical Social Worker

## 2019-06-23 DIAGNOSIS — F324 Major depressive disorder, single episode, in partial remission: Secondary | ICD-10-CM

## 2019-07-06 ENCOUNTER — Inpatient Hospital Stay: Payer: BC Managed Care – PPO | Admitting: Hematology

## 2019-07-06 ENCOUNTER — Inpatient Hospital Stay: Payer: BC Managed Care – PPO

## 2019-07-13 ENCOUNTER — Other Ambulatory Visit: Payer: Self-pay

## 2019-07-14 ENCOUNTER — Encounter: Payer: Self-pay | Admitting: Obstetrics and Gynecology

## 2019-07-14 ENCOUNTER — Ambulatory Visit: Payer: BC Managed Care – PPO | Admitting: Obstetrics and Gynecology

## 2019-07-14 VITALS — BP 122/80 | HR 88 | Temp 97.5°F | Ht 62.5 in | Wt 189.6 lb

## 2019-07-14 DIAGNOSIS — N760 Acute vaginitis: Secondary | ICD-10-CM | POA: Diagnosis not present

## 2019-07-14 NOTE — Patient Instructions (Signed)
Vaginitis  Vaginitis is irritation and swelling (inflammation) of the vagina. It happens when normal bacteria and yeast in the vagina grow too much. There are many types of this condition. Treatment will depend on the type you have. Follow these instructions at home: Lifestyle  Keep your vagina area clean and dry. ? Avoid using soap. ? Rinse the area with water.  Do not do the following until your doctor says it is okay: ? Wash and clean out the vagina (douche). ? Use tampons. ? Have sex.  Wipe from front to back after going to the bathroom.  Let air reach your vagina. ? Wear cotton underwear. ? Do not wear: ? Underwear while you sleep. ? Tight pants. ? Thong underwear. ? Underwear or nylons without a cotton panel. ? Take off any wet clothing, such as bathing suits, as soon as possible.  Use gentle, non-scented products. Do not use things that can irritate the vagina, such as fabric softeners. Avoid the following products if they are scented: ? Feminine sprays. ? Detergents. ? Tampons. ? Feminine hygiene products. ? Soaps or bubble baths.  Practice safe sex and use condoms. General instructions  Take over-the-counter and prescription medicines only as told by your doctor.  If you were prescribed an antibiotic medicine, take or use it as told by your doctor. Do not stop taking or using the antibiotic even if you start to feel better.  Keep all follow-up visits as told by your doctor. This is important. Contact a doctor if:  You have pain in your belly.  You have a fever.  Your symptoms last for more than 2-3 days. Get help right away if:  You have a fever and your symptoms get worse all of a sudden. Summary  Vaginitis is irritation and swelling of the vagina. It can happen when the normal bacteria and yeast in the vagina grow too much. There are many types.  Treatment will depend on the type you have.  Do not douche, use tampons , or have sex until your health  care provider approves. When you can return to sex, practice safe sex and use condoms. This information is not intended to replace advice given to you by your health care provider. Make sure you discuss any questions you have with your health care provider. Document Released: 01/17/2009 Document Revised: 10/03/2017 Document Reviewed: 11/12/2016 Elsevier Patient Education  2020 Elsevier Inc.  

## 2019-07-14 NOTE — Progress Notes (Signed)
GYNECOLOGY  VISIT   HPI: 33 y.o.   Single  African American  female   G0P0 with Patient's last menstrual period was 07/02/2019 (exact date).   here for grey/yellow vaginal discharge , itching and odor.   Itching is internal.   Seen on 06/18/19 by Evalee Mutton for vaginitis and STD screening.  STD screening was negative.  She tested positive for BV and was treated with Flagyl 500 mg po bid x 7 days.  This resolved her symptoms.   States she has had recurrent BV in the past (years ago).  No change in products.  She is not douching.  No change in partner.  She tried an Char[employer] fundraiser, and the odor went away.   Feeling so much better now that her anemia is under control.  Working from home during the pandemic. 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   GYNECOLOGIC HISTORY: Patient's last menstrual period was 07/02/2019 (exact date). Contraception:  Nuvaring Menopausal hormone therapy:  n/a Last mammogram:  n/a Last pap smear: 09-30-18 Neg:Neg HR HPV                    07-24-15 Neg        OB History    Gravida  0   Para      Term      Preterm      AB      Living        SAB      TAB      Ectopic      Multiple      Live Births                 Patient Active Problem List   Diagnosis Date Noted  . Menorrhagia 12/31/2018  . Iron deficiency anemia due to chronic blood loss 12/28/2018  . 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 Seborrheic dermatitis 03/04/2014    Past Medical History:  Diagnosis Date  . Dysmenorrhea   . Eczema    arms and neck  . Fibroid   . Heart murmur    dx'd as a teenager and told would outgrow  . 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  Marland Kitchen Hormone disorder    enlarged thyroid  . Psoriasis of scalp   . Seasonal allergies   . STD (sexually transmitted disease)    Tx'd for Chlamydia and Gonorrhea age 26    Past Surgical History:  Procedure Laterality Date  . EYE SURGERY     to remove styes at age 75  . MYOMECTOMY   05/21/2017   laparoscopic - labor and vaginal delivery not recommended by [facility]  . wisdom teeth Bilateral     Current Outpatient Medications  Medication Sig Dispense Refill  . acetaminophen (TYLENOL) 500 MG chewable tablet Chew 500 mg by mouth every 6 (six) hours as needed for pain.    . clobetasol ointment (TEMOVATE) AB-123456789 % Apply 1 application topically as needed.     . etonogestrel-ethinyl estradiol (NUVARING) 0.12-0.015 MG/24HR vaginal ring Place 1 each vaginally every 28 (twenty-eight) days. Place a new ring every 28 days. 3 each 3  . famciclovir (FAMVIR) 500 MG tablet Take two tablets (1000 mg) by mouth twice a day for one day as needed. 30 tablet 1  . triamcinolone ointment (KENALOG) 0.1 % Daily to eczema, once resolved, continue only on weekends to prevent recurrences     No current facility-administered medications for this visit.  ALLERGIES: Acyclovir and related, Ampicillin, Pollen extract, and Valtrex [valacyclovir]  Family History  Problem Relation Age of Onset  . Hypertension Mother   . Thyroid disease Mother   . Diabetes Father   . Hypertension Father   . Diabetes Maternal Grandmother   . Hypertension Maternal Grandmother   . Stroke Maternal Grandmother   . Diabetes Maternal Grandfather   . Hypertension Maternal Grandfather   . Hypertension Paternal Grandmother   . Hypertension Paternal Grandfather     Social History   Socioeconomic History  . Marital status: Single    Spouse name: Not on file  . Number of children: Not on file  . Years of education: Not on file  . Highest education level: Not on file  Occupational History  . Not on file  Social Needs  . Financial resource strain: Not on file  . Food insecurity    Worry: Not on file    Inability: Not on file  . Transportation needs    Medical: Not on file    Non-medical: Not on file  Tobacco Use  . Smoking status: Never Smoker  . Smokeless tobacco: Never Used  Substance and Sexual Activity  .  Alcohol use: Yes    Alcohol/week: 0.0 standard drinks    Comment: once every few months  . Drug use: No  . Sexual activity: Yes    Partners: Male    Birth control/protection: Condom, Inserts    Comment: nuvaring  Lifestyle  . Physical activity    Days per week: Not on file    Minutes per session: Not on file  . Stress: Not on file  Relationships  . Social Herbalist on phone: Not on file    Gets together: Not on file    Attends religious service: Not on file    Active member of club or organization: Not on file    Attends meetings of clubs or organizations: Not on file    Relationship status: Not on file  . Intimate partner violence    Fear of current or ex partner: Not on file    Emotionally abused: Not on file    Physically abused: Not on file    Forced sexual activity: Not on file  Other Topics Concern  . Not on file  Social History Narrative  . Not on file    Review of Systems  All other systems reviewed and are negative.   PHYSICAL EXAMINATION:    BP 122/80 (Cuff Size: Large)   Pulse 88   Temp (!) 97.5 F (36.4 C) (Temporal)   Ht 5' 2.5" (1.588 m)   Wt 189 lb 9.6 oz (86 kg)   LMP 07/02/2019 (Exact Date)   BMI 34.13 kg/m     General appearance: alert, cooperative and appears stated age  Pelvic: External genitalia:  no lesions              Urethra:  normal appearing urethra with no masses, tenderness or lesions              Bartholins and Skenes: normal                 Vagina: normal appearing vagina with normal color and whitish thick discharge, no lesions              Cervix: no lesions                Bimanual Exam:  Uterus:  normal size, contour, position,  consistency, mobility, non-tender              Adnexa: no mass, fullness, tenderness           Chaperone was present for exam.  ASSESSMENT  Vaginitis.  Recent tx of BV. Negative STD screening.   PLAN  We discussed vaginitis and recurrent vaginitis.  We reviewed risk factors for BV  and yeast.  Affirm taken.  Will await results for treatment.    An After Visit Summary was printed and given to the patient.  _15_____ minutes face to face time of which over 50% was spent in counseling.

## 2019-07-15 ENCOUNTER — Telehealth: Payer: Self-pay | Admitting: Obstetrics and Gynecology

## 2019-07-15 LAB — VAGINITIS/VAGINOSIS, DNA PROBE
Candida Species: POSITIVE — AB
Gardnerella vaginalis: POSITIVE — AB
Trichomonas vaginosis: NEGATIVE

## 2019-07-15 MED ORDER — METRONIDAZOLE 500 MG PO TABS: 500.0000 mg | ORAL_TABLET | Freq: Two times a day (BID) | ORAL | 0 refills | Status: DC

## 2019-07-15 MED ORDER — FLUCONAZOLE 150 MG PO TABS: 150.0000 mg | ORAL_TABLET | Freq: Once | ORAL | 0 refills | Status: AC

## 2019-07-15 NOTE — Telephone Encounter (Signed)
Dr. Quincy Simmonds -please review 07/14/19 vaginitis results.

## 2019-07-15 NOTE — Telephone Encounter (Signed)
Patient calling for test results from [facility] 07/14/19.

## 2019-07-15 NOTE — Addendum Note (Signed)
Addended by: Yisroel Ramming, Dietrich Pates E on: 07/15/2019 05:13 PM   Modules accepted: Orders

## 2019-07-17 NOTE — Telephone Encounter (Signed)
Results and prescriptions sent in on 07/15/19.  Communication given through My Chart message from me to patient.   Encounter closed.

## 2019-07-22 ENCOUNTER — Ambulatory Visit (INDEPENDENT_AMBULATORY_CARE_PROVIDER_SITE_OTHER): Payer: BC Managed Care – PPO | Admitting: Licensed Clinical Social Worker

## 2019-07-22 DIAGNOSIS — F324 Major depressive disorder, single episode, in partial remission: Secondary | ICD-10-CM | POA: Diagnosis not present

## 2019-08-10 ENCOUNTER — Telehealth: Payer: Self-pay | Admitting: Obstetrics and Gynecology

## 2019-08-10 NOTE — Telephone Encounter (Signed)
Call to patient. Patient states that she completed the flagyl and diflucan mid September for yeast and BV and her symptoms resolved, but they started back again yesterday. States the discharge is clear and there is a faint odor. States odor is not foul. Hasn't used the nuvaring in months as she states she is not currently sexually active. LMP was 07-25-2019. OV recommended. Patient agreeable. 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 RN advised would need to review schedule with Dr. Quincy Simmonds and return call. Patient agreeable.   Routing to provider to advise on appointment slot.

## 2019-08-10 NOTE — Telephone Encounter (Signed)
Please offer a 4:30 slot for her for this week.

## 2019-08-10 NOTE — Telephone Encounter (Signed)
Patient is having same symptoms as before. Not sure if she should schedule appointment.

## 2019-08-11 ENCOUNTER — Other Ambulatory Visit: Payer: Self-pay

## 2019-08-11 ENCOUNTER — Ambulatory Visit: Payer: BC Managed Care – PPO | Admitting: Obstetrics and Gynecology

## 2019-08-11 ENCOUNTER — Encounter: Payer: Self-pay | Admitting: Obstetrics and Gynecology

## 2019-08-11 VITALS — BP 122/84 | HR 80 | Temp 98.0°F | Ht 62.5 in | Wt 190.6 lb

## 2019-08-11 DIAGNOSIS — N761 Subacute and chronic vaginitis: Secondary | ICD-10-CM | POA: Diagnosis not present

## 2019-08-11 NOTE — Telephone Encounter (Signed)
Call to patient. Patient scheduled for today at 31 with Dr. Quincy Simmonds. Patient agreeable to date and time of appointment. Covid prescreening negative.   Routing to provider and will close encounter.

## 2019-08-11 NOTE — Progress Notes (Signed)
GYNECOLOGY  VISIT   HPI: 33 y.o.   Single  African American  female   G0P0 with Patient's last menstrual period was 06/24/2019 (exact date).   here for vaginal discharge with odor. Frustrated with recurrent BV.   Watery discharge.  No dysuria.   She was treated for yeast and BV in September. She was treated with Flagyl and Diflucan.   Had BV in August also.   Tested negative in January.   No new partner.  She had negative STD testing in August.  No Hep B testing done.  She thinks she had vaccination for this.   GYNECOLOGIC HISTORY: Patient's last menstrual period was 06/24/2019 (exact date). Contraception:  Nuvaring Menopausal hormone therapy: n/a Last mammogram:  n/a Last pap smear: 09-30-18 Neg:Neg HR HPV                               07-24-15 Neg        OB History    Gravida  0   Para      Term      Preterm      AB      Living        SAB      TAB      Ectopic      Multiple      Live Births                 Patient Active Problem List   Diagnosis Date Noted  . Menorrhagia 12/31/2018  . Iron deficiency anemia due to chronic blood loss 12/28/2018  . 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 Seborrheic dermatitis 03/04/2014    Past Medical History:  Diagnosis Date  . Dysmenorrhea   . Eczema    arms and neck  . Fibroid   . Heart murmur    dx'd as a teenager and told would outgrow  . 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  Marland Kitchen Hormone disorder    enlarged thyroid  . Psoriasis of scalp   . Seasonal allergies   . STD (sexually transmitted disease)    Tx'd for Chlamydia and Gonorrhea age 34    Past Surgical History:  Procedure Laterality Date  . EYE SURGERY     to remove styes at age 35  . MYOMECTOMY  05/21/2017   laparoscopic - labor and vaginal delivery not recommended by [facility]  . wisdom teeth Bilateral     Current Outpatient Medications  Medication Sig Dispense Refill  . acetaminophen (TYLENOL) 500 MG chewable tablet Chew 500  mg by mouth every 6 (six) hours as needed for pain.    . clobetasol ointment (TEMOVATE) AB-[id number] % Apply 1 application topically as needed.     . etonogestrel-ethinyl estradiol (NUVARING) 0.12-0.015 MG/24HR vaginal ring Place 1 each vaginally every 28 (twenty-eight) days. Place a new ring every 28 days. 3 each 3  . famciclovir (FAMVIR) 500 MG tablet Take two tablets (1000 mg) by mouth twice a day for one day as needed. 30 tablet 1  . triamcinolone ointment (KENALOG) 0.1 % Daily to eczema, once resolved, continue only on weekends to prevent recurrences     No current facility-administered medications for this visit.      ALLERGIES: Acyclovir and related, Ampicillin, Pollen extract, and Valtrex [valacyclovir]  Family History  Problem Relation Age of Onset  . Hypertension Mother   . Thyroid disease Mother   . Diabetes Father   .  Hypertension Father   . Diabetes Maternal Grandmother   . Hypertension Maternal Grandmother   . Stroke Maternal Grandmother   . Diabetes Maternal Grandfather   . Hypertension Maternal Grandfather   . Hypertension Paternal Grandmother   . Hypertension Paternal Grandfather     Social History   Socioeconomic History  . Marital status: Single    Spouse name: Not on file  . Number of children: Not on file  . Years of education: Not on file  . Highest education level: Not on file  Occupational History  . Not on file  Social Needs  . Financial resource strain: Not on file  . Food insecurity    Worry: Not on file    Inability: Not on file  . Transportation needs    Medical: Not on file    Non-medical: Not on file  Tobacco Use  . Smoking status: Never Smoker  . Smokeless tobacco: Never Used  Substance and Sexual Activity  . Alcohol use: Yes    Alcohol/week: 0.0 standard drinks    Comment: once every few months  . Drug use: No  . Sexual activity: Yes    Partners: Male    Birth control/protection: Condom, Inserts    Comment: nuvaring  Lifestyle  .  Physical activity    Days per week: Not on file    Minutes per session: Not on file  . Stress: Not on file  Relationships  . Social Herbalist on phone: Not on file    Gets together: Not on file    Attends religious service: Not on file    Active member of club or organization: Not on file    Attends meetings of clubs or organizations: Not on file    Relationship status: Not on file  . Intimate partner violence    Fear of current or ex partner: Not on file    Emotionally abused: Not on file    Physically abused: Not on file    Forced sexual activity: Not on file  Other Topics Concern  . Not on file  Social History Narrative  . Not on file    Review of Systems  All other systems reviewed and are negative.   PHYSICAL EXAMINATION:    BP 122/84 (Cuff Size: Large)   Pulse 80   Temp 98 F (36.7 C) (Temporal)   Ht 5' 2.5" (1.588 m)   Wt 190 lb 9.6 oz (86.5 kg)   LMP 06/24/2019 (Exact Date)   BMI 34.31 kg/m     General appearance: alert, cooperative and appears stated age   Pelvic: External genitalia:  no lesions              Urethra:  normal appearing urethra with no masses, tenderness or lesions              Bartholins and Skenes: normal                 Vagina: normal appearing vagina with normal color and discharge, no lesions              Cervix: no lesions                Bimanual Exam:  Uterus:  normal size, contour, position, consistency, mobility, non-tender              Adnexa: no mass, fullness, tenderness           Chaperone was present for exam.  ASSESSMENT  Recurrent vaginitis, BV.   PLAN  Affirm done.  We discussed risk factors for BV.  If BV is confirmed, will do an acute treatment now and then a suppression regimen.  Can do HBsAg testing in future if desired.  Lab is already closed for today.    An After Visit Summary was printed and given to the patient.  _15_____ minutes face to face time of which over 50% was spent in counseling.

## 2019-08-11 NOTE — Patient Instructions (Signed)
Vaginitis Vaginitis is a condition in which the vaginal tissue swells and becomes red (inflamed). This condition is most often caused by a change in the normal balance of bacteria and yeast that live in the vagina. This change causes an overgrowth of certain bacteria or yeast, which causes the inflammation. There are different types of vaginitis, but the most common types are:  Bacterial vaginosis.  Yeast infection (candidiasis).  Trichomoniasis vaginitis. This is a sexually transmitted disease (STD).  Viral vaginitis.  Atrophic vaginitis.  Allergic vaginitis. What are the causes? The cause of this condition depends on the type of vaginitis. It can be caused by:  Bacteria (bacterial vaginosis).  Yeast, which is a fungus (yeast infection).  A parasite (trichomoniasis vaginitis).  A virus (viral vaginitis).  Low hormone levels (atrophic vaginitis). Low hormone levels can occur during pregnancy, breastfeeding, or after menopause.  Irritants, such as bubble baths, scented tampons, and feminine sprays (allergic vaginitis). Other factors can change the normal balance of the yeast and bacteria that live in the vagina. These include:  Antibiotic medicines.  Poor hygiene.  Diaphragms, vaginal sponges, spermicides, birth control pills, and intrauterine devices (IUD).  Sex.  Infection.  Uncontrolled diabetes.  A weakened defense (immune) system. What increases the risk? This condition is more likely to develop in women who:  Smoke.  Use vaginal douches, scented tampons, or scented sanitary pads.  Wear tight-fitting pants.  Wear thong underwear.  Use oral birth control pills or an IUD.  Have sex without a condom.  Have multiple sex partners.  Have an STD.  Frequently use the spermicide nonoxynol-9.  Eat lots of foods high in sugar.  Have uncontrolled diabetes.  Have low estrogen levels.  Have a weakened immune system from an immune disorder or medical  treatment.  Are pregnant or breastfeeding. What are the signs or symptoms? Symptoms vary depending on the cause of the vaginitis. Common symptoms include:  Abnormal vaginal discharge. ? The discharge is white, gray, or yellow with bacterial vaginosis. ? The discharge is thick, white, and cheesy with a yeast infection. ? The discharge is frothy and yellow or greenish with trichomoniasis.  A bad vaginal smell. The smell is fishy with bacterial vaginosis.  Vaginal itching, pain, or swelling.  Sex that is painful.  Pain or burning when urinating. Sometimes there are no symptoms. How is this diagnosed? This condition is diagnosed based on your symptoms and medical history. A physical exam, including a pelvic exam, will also be done. You may also have other tests, including:  Tests to determine the pH level (acidity or alkalinity) of your vagina.  A whiff test, to assess the odor that results when a sample of your vaginal discharge is mixed with a potassium hydroxide solution.  Tests of vaginal fluid. A sample will be examined under a microscope. How is this treated? Treatment varies depending on the type of vaginitis you have. Your treatment may include:  Antibiotic creams or pills to treat bacterial vaginosis and trichomoniasis.  Antifungal medicines, such as vaginal creams or suppositories, to treat a yeast infection.  Medicine to ease discomfort if you have viral vaginitis. Your sexual partner should also be treated.  Estrogen delivered in a cream, pill, suppository, or vaginal ring to treat atrophic vaginitis. If vaginal dryness occurs, lubricants and moisturizing creams may help. You may need to avoid scented soaps, sprays, or douches.  Stopping use of a product that is causing allergic vaginitis. Then using a vaginal cream to treat the symptoms. Follow   these instructions at home: Lifestyle  Keep your genital area clean and dry. Avoid soap, and only rinse the area with  water.  Do not douche or use tampons until your health care provider says it is okay to do so. Use sanitary pads, if needed.  Do not have sex until your health care provider approves. When you can return to sex, practice safe sex and use condoms.  Wipe from front to back. This avoids the spread of bacteria from the rectum to the vagina. General instructions  Take over-the-counter and prescription medicines only as told by your health care provider.  If you were prescribed an antibiotic medicine, take or use it as told by your health care provider. Do not stop taking or using the antibiotic even if you start to feel better.  Keep all follow-up visits as told by your health care provider. This is important. How is this prevented?  Use mild, non-scented products. Do not use things that can irritate the vagina, such as fabric softeners. Avoid the following products if they are scented: ? Feminine sprays. ? Detergents. ? Tampons. ? Feminine hygiene products. ? Soaps or bubble baths.  Let air reach your genital area. ? Wear cotton underwear to reduce moisture buildup. ? Avoid wearing underwear while you sleep. ? Avoid wearing tight pants and underwear or nylons without a cotton panel. ? Avoid wearing thong underwear.  Take off any wet clothing, such as bathing suits, as soon as possible.  Practice safe sex and use condoms. Contact a health care provider if:  You have abdominal pain.  You have a fever.  You have symptoms that last for more than 2-3 days. Get help right away if:  You have a fever and your symptoms suddenly get worse. Summary  Vaginitis is a condition in which the vaginal tissue becomes inflamed.This condition is most often caused by a change in the normal balance of bacteria and yeast that live in the vagina.  Treatment varies depending on the type of vaginitis you have.  Do not douche, use tampons , or have sex until your health care provider approves. When  you can return to sex, practice safe sex and use condoms. This information is not intended to replace advice given to you by your health care provider. Make sure you discuss any questions you have with your health care provider. Document Released: 08/18/2007 Document Revised: 10/03/2017 Document Reviewed: 11/26/2016 Elsevier Patient Education  2020 Elsevier Inc.  

## 2019-08-12 ENCOUNTER — Encounter: Payer: Self-pay | Admitting: Obstetrics and Gynecology

## 2019-08-12 LAB — VAGINITIS/VAGINOSIS, DNA PROBE
Candida Species: NEGATIVE
Gardnerella vaginalis: NEGATIVE
Trichomonas vaginosis: NEGATIVE

## 2019-08-13 ENCOUNTER — Ambulatory Visit: Payer: BC Managed Care – PPO | Admitting: Obstetrics and Gynecology

## 2019-08-13 ENCOUNTER — Telehealth: Payer: Self-pay | Admitting: Obstetrics and Gynecology

## 2019-08-13 ENCOUNTER — Encounter: Payer: Self-pay | Admitting: Obstetrics and Gynecology

## 2019-08-13 ENCOUNTER — Other Ambulatory Visit: Payer: Self-pay

## 2019-08-13 VITALS — BP 122/80 | HR 80 | Temp 97.1°F | Resp 12 | Wt 189.0 lb

## 2019-08-13 DIAGNOSIS — Z113 Encounter for screening for infections with a predominantly sexual mode of transmission: Secondary | ICD-10-CM

## 2019-08-13 DIAGNOSIS — N898 Other specified noninflammatory disorders of vagina: Secondary | ICD-10-CM | POA: Diagnosis not present

## 2019-08-13 NOTE — Telephone Encounter (Signed)
Patient sent the following correspondence through [place].  Hi Dr. Quincy Simmonds,  I seen the test results from the Bacterial Vaginosis was negative. However, Im still having problems, could I get a different bacterial vaginosis test or get an STD test. I am available all day tomorrow Friday August 13, 2019.  Please let me know   Thanks

## 2019-08-13 NOTE — Telephone Encounter (Signed)
Spoke with patient. Reports no change in symptoms, seen in office on 10/7. Requesting OV. Covid 19 prescreen negative, precautions reviewed. OV scheduled for today at 10am with Dr. Quincy Simmonds. Patient verbalizes understanding and is agreeable.   Encounter closed.

## 2019-08-13 NOTE — Progress Notes (Signed)
GYNECOLOGY  VISIT   HPI: 33 y.o.   Single  African American  female   G0P0 with Patient's last menstrual period was 07/25/2019.   here for vaginal discharge.  She had a negative Affirm test done on 08/11/19.  She has a hx of BV and yeast.  Today she has discharge but not odor or itching.  I suggested she return to do a Nuswab test.  She had negative GC/CT on 06/18/19.   She is not using her NuvaRing.  It gives her a headache.  GYNECOLOGIC HISTORY: Patient's last menstrual period was 07/25/2019. Contraception:  abstinence Menopausal hormone therapy:  n/a Last mammogram:  n/a Last pap smear:   09/30/18 Neg;Neg HR HPV        OB History    Gravida  0   Para      Term      Preterm      AB      Living        SAB      TAB      Ectopic      Multiple      Live Births                 Patient Active Problem List   Diagnosis Date Noted  . Menorrhagia 12/31/2018  . Iron deficiency anemia due to chronic blood loss 12/28/2018  . 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 Seborrheic dermatitis 03/04/2014    Past Medical History:  Diagnosis Date  . Dysmenorrhea   . Eczema    arms and neck  . Fibroid   . Heart murmur    dx'd as a teenager and told would outgrow  . 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  Marland Kitchen Hormone disorder    enlarged thyroid  . Psoriasis of scalp   . Seasonal allergies   . STD (sexually transmitted disease)    Tx'd for Chlamydia and Gonorrhea age 67    Past Surgical History:  Procedure Laterality Date  . EYE SURGERY     to remove styes at age 43  . MYOMECTOMY  05/21/2017   laparoscopic - labor and vaginal delivery not recommended by [facility]  . wisdom teeth Bilateral     Current Outpatient Medications  Medication Sig Dispense Refill  . acetaminophen (TYLENOL) 500 MG chewable tablet Chew 500 mg by mouth every 6 (six) hours as needed for pain.    . famciclovir (FAMVIR) 500 MG tablet Take two tablets (1000 mg) by mouth twice a day for  one day as needed. 30 tablet 1  . triamcinolone ointment (KENALOG) 0.1 % Daily to eczema, once resolved, continue only on weekends to prevent recurrences    . clobetasol ointment (TEMOVATE) AB-123456789 % Apply 1 application topically as needed.     . etonogestrel-ethinyl estradiol (NUVARING) 0.12-0.015 MG/24HR vaginal ring Place 1 each vaginally every 28 (twenty-eight) days. Place a new ring every 28 days. (Patient not taking: Reported on 08/13/2019) 3 each 3   No current facility-administered medications for this visit.      ALLERGIES: Acyclovir and related, Ampicillin, Pollen extract, and Valtrex [valacyclovir]  Family History  Problem Relation Age of Onset  . Hypertension Mother   . Thyroid disease Mother   . Diabetes Father   . Hypertension Father   . Diabetes Maternal Grandmother   . Hypertension Maternal Grandmother   . Stroke Maternal Grandmother   . Diabetes Maternal Grandfather   . Hypertension Maternal Grandfather   . Hypertension  Paternal Grandmother   . Hypertension Paternal Grandfather     Social History   Socioeconomic History  . Marital status: Single    Spouse name: Not on file  . Number of children: Not on file  . Years of education: Not on file  . Highest education level: Not on file  Occupational History  . Not on file  Social Needs  . Financial resource strain: Not on file  . Food insecurity    Worry: Not on file    Inability: Not on file  . Transportation needs    Medical: Not on file    Non-medical: Not on file  Tobacco Use  . Smoking status: Never Smoker  . Smokeless tobacco: Never Used  Substance and Sexual Activity  . Alcohol use: Yes    Alcohol/week: 0.0 standard drinks    Comment: once every few months  . Drug use: No  . Sexual activity: Not Currently    Partners: Male    Birth control/protection: Abstinence    Comment: nuvaring  Lifestyle  . Physical activity    Days per week: Not on file    Minutes per session: Not on file  . Stress: Not  on file  Relationships  . Social Herbalist on phone: Not on file    Gets together: Not on file    Attends religious service: Not on file    Active member of club or organization: Not on file    Attends meetings of clubs or organizations: Not on file    Relationship status: Not on file  . Intimate partner violence    Fear of current or ex partner: Not on file    Emotionally abused: Not on file    Physically abused: Not on file    Forced sexual activity: Not on file  Other Topics Concern  . Not on file  Social History Narrative  . Not on file    Review of Systems  Constitutional: Negative.   HENT: Negative.   Eyes: Negative.   Respiratory: Negative.   Cardiovascular: Negative.   Gastrointestinal: Negative.   Endocrine: Negative.   Genitourinary: Positive for vaginal discharge.  Musculoskeletal: Negative.   Skin: Negative.   Allergic/Immunologic: Negative.   Neurological: Negative.   Hematological: Negative.   Psychiatric/Behavioral: Negative.     PHYSICAL EXAMINATION:    BP 122/80 (BP Location: Left Arm, Patient Position: Sitting, Cuff Size: Normal)   Pulse 80   Temp (!) 97.1 F (36.2 C) (Temporal)   Resp 12   Wt 189 lb (85.7 kg)   LMP 07/25/2019   BMI 34.02 kg/m     General appearance: alert, cooperative and appears stated age   Pelvic: External genitalia:  no lesions              Urethra:  normal appearing urethra with no masses, tenderness or lesions              Bartholins and Skenes: normal                 Vagina: normal appearing vagina with normal color and moderate white creamy discharge, no lesions              Cervix: no lesions                Bimanual Exam:  Uterus:  normal size, contour, position, consistency, mobility, non-tender              Adnexa: no mass, fullness,  tenderness         Chaperone was present for exam.  ASSESSMENT  Vaginal discharge.  Hx BV and yeast.   STD screening.  Hx HSV II.  Off NuvaRing.   PLAN  We  discussed vaginal discharge and various causes.  Nuswab testing for GC/chlamydia/trichomonas/BV/yeast.  HBSAg.  Condoms recommended.  She is interested in Metrogel suppression regimen even if her testing is negative for BV today.   An After Visit Summary was printed and given to the patient.  __15____ minutes face to face time of which over 50% was spent in counseling.

## 2019-08-14 LAB — HEPATITIS B SURFACE ANTIGEN: Hepatitis B Surface Ag: NEGATIVE

## 2019-08-16 LAB — NUSWAB VAGINITIS PLUS (VG+)
Candida albicans, NAA: NEGATIVE
Candida glabrata, NAA: NEGATIVE
Chlamydia trachomatis, NAA: NEGATIVE
Neisseria gonorrhoeae, NAA: NEGATIVE
Trich vag by NAA: NEGATIVE

## 2019-08-18 ENCOUNTER — Ambulatory Visit: Payer: BC Managed Care – PPO | Admitting: Licensed Clinical Social Worker

## 2019-08-18 ENCOUNTER — Other Ambulatory Visit: Payer: Self-pay | Admitting: *Deleted

## 2019-08-18 MED ORDER — METRONIDAZOLE 0.75 % VA GEL: 1.0000 | VAGINAL | 3 refills | Status: DC

## 2019-08-24 ENCOUNTER — Ambulatory Visit (INDEPENDENT_AMBULATORY_CARE_PROVIDER_SITE_OTHER): Payer: BC Managed Care – PPO | Admitting: Licensed Clinical Social Worker

## 2019-08-24 DIAGNOSIS — F324 Major depressive disorder, single episode, in partial remission: Secondary | ICD-10-CM | POA: Diagnosis not present

## 2019-08-26 ENCOUNTER — Other Ambulatory Visit: Payer: Self-pay

## 2019-08-26 MED ORDER — FAMCICLOVIR 500 MG PO TABS: ORAL_TABLET | ORAL | 0 refills | Status: DC

## 2019-08-26 NOTE — Telephone Encounter (Signed)
Medication refill request: Famciclovir Last AEX:  09/14/18 BS Last OV: 08/13/19 Next AEX: none scheduled Last MMG (if hormonal medication request): n/a Refill authorized: Please advise on refill; Order pended #30 w/1 refill if authorized

## 2019-08-31 NOTE — Progress Notes (Signed)
33 y.o. G0P0 Single African American female here for annual exam.    Stopped Nuvaring in July due to headaches.  Patient also complaining of lower pelvic pain--either side. Hx of myomectomy.  Patient is worried that something has returned.   She is still having abnormal vaginal discharge. She is really worried about this.  Concerned about fibroids.  No itching or burning.  She has odor.  She had negative testing for BV, yeast, trich, gonorrhea, and chlamydia on 08/13/19.  She just started her Metrogel 2 days ago and is starting her suppression regimen.   Not taking iron supplementation for her hx of anemia.   PCP:  None   Patient's last menstrual period was 08/18/2019 (exact date).     Period Cycle (Days): 30 Period Duration (Days): 5 day Period Pattern: Regular Menstrual Flow: (heavy first 2 days then tapers) Menstrual Control: Maxi pad Menstrual Control Change Freq (Hours): changes pad every 3-4 hours on heaviest day Dysmenorrhea: None(headaches)     Sexually active: Yes.    The current method of family planning is condoms.    Exercising: Yes.    boot camp Smoker:  no  Health Maintenance: Pap: 09-30-18 Neg:Neg HR HPV, 07-24-15 Neg History of abnormal Pap:  no MMG:  n/a Colonoscopy:  n/a BMD:   n/a  Result  n/a TDaP:  2017 per patient Gardasil:   yes HIV:06-18-19 NR Hep C:06-18-19 Neg Screening Labs:   Flu vaccine:  Recommended.    reports that she has never smoked. She has never used smokeless tobacco. She reports previous alcohol use. She reports that she does not use drugs.  Past Medical History:  Diagnosis Date  . Dysmenorrhea   . Eczema    arms and neck  . Fibroid   . Heart murmur    dx'd as a teenager and told would outgrow  . 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  Marland Kitchen Hormone disorder    enlarged thyroid  . Psoriasis of scalp   . Seasonal allergies   . STD (sexually transmitted disease)    Tx'd for Chlamydia and Gonorrhea age 61    Past Surgical  History:  Procedure Laterality Date  . EYE SURGERY     to remove styes at age 64  . MYOMECTOMY  05/21/2017   laparoscopic - labor and vaginal delivery not recommended by [facility]  . wisdom teeth Bilateral     Current Outpatient Medications  Medication Sig Dispense Refill  . acetaminophen (TYLENOL) 500 MG chewable tablet Chew 500 mg by mouth every 6 (six) hours as needed for pain.    . clobetasol ointment (TEMOVATE) AB-123456789 % Apply 1 application topically as needed.     . famciclovir (FAMVIR) 500 MG tablet Take two tablets (1000 mg) by mouth twice a day for one day as needed. 30 tablet 0  . metroNIDAZOLE (METROGEL) 0.75 % vaginal gel Place 1 Applicatorful vaginally 2 (two) times a week. 70 g 3  . triamcinolone ointment (KENALOG) 0.1 % Daily to eczema, once resolved, continue only on weekends to prevent recurrences    . etonogestrel-ethinyl estradiol (NUVARING) 0.12-0.015 MG/24HR vaginal ring Place 1 each vaginally every 28 (twenty-eight) days. Place a new ring every 28 days. (Patient not taking: Reported on 09/01/2019) 3 each 3   No current facility-administered medications for this visit.     Family History  Problem Relation Age of Onset  . Hypertension Mother   . Thyroid disease Mother   . Diabetes Father   . Hypertension Father   .  Diabetes Maternal Grandmother   . Hypertension Maternal Grandmother   . Stroke Maternal Grandmother   . Diabetes Maternal Grandfather   . Hypertension Maternal Grandfather   . Hypertension Paternal Grandmother   . Hypertension Paternal Grandfather     Review of Systems  Musculoskeletal:       Lower pelvic pain   All other systems reviewed and are negative.   Exam:   BP 138/80 (Cuff Size: Large)   Pulse 76   Temp 97.7 F (36.5 C) (Temporal)   Resp 18   Ht 5\' 3"  (1.6 m)   Wt 189 lb 12.8 oz (86.1 kg)   LMP 08/18/2019 (Exact Date)   BMI 33.62 kg/m     General appearance: alert, cooperative and appears stated age Head: normocephalic, without  obvious abnormality, atraumatic Neck: no adenopathy, supple, symmetrical, trachea midline and thyroid normal to inspection and palpation Lungs: clear to auscultation bilaterally Breasts: normal appearance, no masses or tenderness, No nipple retraction or dimpling, No nipple discharge or bleeding, No axillary adenopathy Heart: regular rate and rhythm Abdomen: soft, non-tender; no masses, no organomegaly Extremities: extremities normal, atraumatic, no cyanosis or edema Skin: skin color, texture, turgor normal. No rashes or lesions Lymph nodes: cervical, supraclavicular, and axillary nodes normal. Neurologic: grossly normal  Pelvic: External genitalia:  no lesions              No abnormal inguinal nodes palpated.              Urethra:  normal appearing urethra with no masses, tenderness or lesions              Bartholins and Skenes: normal                 Vagina: normal appearing vagina with normal color and some gel in the vagina, no lesions              Cervix: no lesions              Pap taken: No. Bimanual Exam:  Uterus:  Possible anterior LUS 2 cm fibroid.               Adnexa: no mass, fullness, tenderness           Chaperone was present for exam.  Assessment:   Well woman visit with normal exam. Pelvic pain.  Hx anemia.  Status post iron infusion in March, 2020.  Status post laparoscopic myomectomy.  Chronic vaginitis.  On Metrogel suppression - just started. Hx HSV.  Hx GC/CT.  Plan: Mammogram screening discussed. Self breast awareness reviewed. Pap and HR HPV as above. Guidelines for Calcium, Vitamin D, regular exercise program including cardiovascular and weight bearing exercise. Routine labs, iron, ferritin.  Vaginitis screening and GC/CT per request.  Refill Famvir.  Return for pelvic ultrasound.  Follow up annually and prn.   After visit summary provided.

## 2019-09-01 ENCOUNTER — Encounter: Payer: Self-pay | Admitting: Obstetrics and Gynecology

## 2019-09-01 ENCOUNTER — Ambulatory Visit: Payer: BC Managed Care – PPO | Admitting: Obstetrics and Gynecology

## 2019-09-01 ENCOUNTER — Other Ambulatory Visit: Payer: Self-pay

## 2019-09-01 ENCOUNTER — Other Ambulatory Visit (HOSPITAL_COMMUNITY)
Admission: RE | Admit: 2019-09-01 | Discharge: 2019-09-01 | Disposition: A | Discharge status: Home / Self Care | Payer: BC Managed Care – PPO | Source: Ambulatory Visit | Attending: Obstetrics and Gynecology | Admitting: Obstetrics and Gynecology

## 2019-09-01 VITALS — BP 138/80 | HR 76 | Temp 97.7°F | Resp 18 | Ht 63.0 in | Wt 189.8 lb

## 2019-09-01 DIAGNOSIS — Z862 Personal history of diseases of the blood and blood-forming organs and certain disorders involving the immune mechanism: Secondary | ICD-10-CM

## 2019-09-01 DIAGNOSIS — N761 Subacute and chronic vaginitis: Secondary | ICD-10-CM | POA: Diagnosis present

## 2019-09-01 DIAGNOSIS — Z113 Encounter for screening for infections with a predominantly sexual mode of transmission: Secondary | ICD-10-CM | POA: Insufficient documentation

## 2019-09-01 DIAGNOSIS — Z01419 Encounter for gynecological examination (general) (routine) without abnormal findings: Secondary | ICD-10-CM

## 2019-09-01 DIAGNOSIS — R102 Pelvic and perineal pain: Secondary | ICD-10-CM

## 2019-09-01 DIAGNOSIS — Z86018 Personal history of other benign neoplasm: Secondary | ICD-10-CM

## 2019-09-01 MED ORDER — FAMCICLOVIR 500 MG PO TABS: ORAL_TABLET | ORAL | 0 refills | Status: DC

## 2019-09-02 LAB — CBC
Hematocrit: 36 % (ref 34.0–46.6)
Hemoglobin: 11.5 g/dL (ref 11.1–15.9)
MCH: 27.4 pg (ref 26.6–33.0)
MCHC: 31.9 g/dL (ref 31.5–35.7)
MCV: 86 fL (ref 79–97)
Platelets: 365 10*3/uL (ref 150–450)
RBC: 4.19 x10E6/uL (ref 3.77–5.28)
RDW: 12.7 % (ref 11.7–15.4)
WBC: 5.9 10*3/uL (ref 3.4–10.8)

## 2019-09-02 LAB — LIPID PANEL
Chol/HDL Ratio: 3.3 ratio (ref 0.0–4.4)
Cholesterol, Total: 170 mg/dL (ref 100–199)
HDL: 51 mg/dL (ref >39)
LDL Chol Calc (NIH): 105 mg/dL — ABNORMAL HIGH (ref 0–99)
Triglycerides: 71 mg/dL (ref 0–149)
VLDL Cholesterol Cal: 14 mg/dL (ref 5–40)

## 2019-09-02 LAB — COMPREHENSIVE METABOLIC PANEL
ALT: 13 IU/L (ref 0–32)
AST: 17 IU/L (ref 0–40)
Albumin/Globulin Ratio: 1.3 (ref 1.2–2.2)
Albumin: 4.2 g/dL (ref 3.8–4.8)
Alkaline Phosphatase: 59 IU/L (ref 39–117)
BUN/Creatinine Ratio: 14 (ref 9–23)
BUN: 10 mg/dL (ref 6–20)
Bilirubin Total: 0.2 mg/dL (ref 0.0–1.2)
CO2: 24 mmol/L (ref 20–29)
Calcium: 9 mg/dL (ref 8.7–10.2)
Chloride: 105 mmol/L (ref 96–106)
Creatinine, Ser: 0.73 mg/dL (ref 0.57–1.00)
GFR calc Af Amer: 127 mL/min/{1.73_m2} (ref >59)
GFR calc non Af Amer: 110 mL/min/{1.73_m2} (ref >59)
Globulin, Total: 3.3 g/dL (ref 1.5–4.5)
Glucose: 81 mg/dL (ref 65–99)
Potassium: 4.3 mmol/L (ref 3.5–5.2)
Sodium: 141 mmol/L (ref 134–144)
Total Protein: 7.5 g/dL (ref 6.0–8.5)

## 2019-09-02 LAB — CERVICOVAGINAL ANCILLARY ONLY
Bacterial Vaginitis (gardnerella): NEGATIVE
Candida Glabrata: NEGATIVE
Candida Vaginitis: NEGATIVE
Chlamydia: NEGATIVE
Comment: NEGATIVE
Comment: NEGATIVE
Comment: NEGATIVE
Comment: NEGATIVE
Comment: NEGATIVE
Comment: NORMAL
Neisseria Gonorrhea: NEGATIVE
Trichomonas: NEGATIVE

## 2019-09-02 LAB — IRON: Iron: 23 ug/dL — ABNORMAL LOW (ref 27–159)

## 2019-09-02 LAB — FERRITIN: Ferritin: 10 ng/mL — ABNORMAL LOW (ref 15–150)

## 2019-09-05 ENCOUNTER — Encounter: Payer: Self-pay | Admitting: Obstetrics and Gynecology

## 2019-09-07 ENCOUNTER — Other Ambulatory Visit: Payer: Self-pay

## 2019-09-07 ENCOUNTER — Ambulatory Visit: Payer: BC Managed Care – PPO | Admitting: Hematology

## 2019-09-07 ENCOUNTER — Other Ambulatory Visit: Payer: BC Managed Care – PPO

## 2019-09-09 ENCOUNTER — Ambulatory Visit (INDEPENDENT_AMBULATORY_CARE_PROVIDER_SITE_OTHER): Payer: BC Managed Care – PPO

## 2019-09-09 ENCOUNTER — Encounter: Payer: Self-pay | Admitting: Obstetrics and Gynecology

## 2019-09-09 ENCOUNTER — Ambulatory Visit: Payer: BC Managed Care – PPO | Admitting: Obstetrics and Gynecology

## 2019-09-09 ENCOUNTER — Other Ambulatory Visit: Payer: Self-pay

## 2019-09-09 VITALS — BP 130/76 | HR 70 | Temp 97.7°F | Ht 62.5 in | Wt 191.8 lb

## 2019-09-09 DIAGNOSIS — E611 Iron deficiency: Secondary | ICD-10-CM | POA: Diagnosis not present

## 2019-09-09 DIAGNOSIS — N761 Subacute and chronic vaginitis: Secondary | ICD-10-CM | POA: Diagnosis not present

## 2019-09-09 DIAGNOSIS — Z86018 Personal history of other benign neoplasm: Secondary | ICD-10-CM

## 2019-09-09 DIAGNOSIS — R102 Pelvic and perineal pain: Secondary | ICD-10-CM | POA: Diagnosis not present

## 2019-09-09 NOTE — Progress Notes (Signed)
GYNECOLOGY  VISIT   HPI: 33 y.o.   Single  African American  female   G0P0 with Patient's last menstrual period was 08/18/2019 (exact date).   here for pelvic ultrasound for lower pelvic pain.   She stopped her NuvaRing in July due to headaches.   She has a hx of myomectomy, and she is worried about recurrent fibroids.   She has been concerned about vaginal discharge and has testing negative for vaginitis and STDs.  She is on a suppression regiment for chronic recurrent BV.  She reports today that her discharge has finally stopped.  She has iron deficiency and her iron and ferritin are low again, 23 and 10 respectively.  Hgb 11.5.  She did an iron infusion in March, 2020.   GYNECOLOGIC HISTORY: Patient's last menstrual period was 08/18/2019 (exact date). Contraception: condoms Menopausal hormone therapy:  n/a Last mammogram: n/a Last pap smear:  09-30-18 Neg:Neg HR HPV, 07-24-15 Neg        OB History    Gravida  0   Para      Term      Preterm      AB      Living        SAB      TAB      Ectopic      Multiple      Live Births                 Patient Active Problem List   Diagnosis Date Noted  . Menorrhagia 12/31/2018  . Iron deficiency anemia due to chronic blood loss 12/28/2018  . 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 Seborrheic dermatitis 03/04/2014    Past Medical History:  Diagnosis Date  . Dysmenorrhea   . Eczema    arms and neck  . Fibroid   . Heart murmur    dx'd as a teenager and told would outgrow  . 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  Marland Kitchen Hormone disorder    enlarged thyroid  . Iron deficiency   . Psoriasis of scalp   . Seasonal allergies   . STD (sexually transmitted disease)    Tx'd for Chlamydia and Gonorrhea age 78    Past Surgical History:  Procedure Laterality Date  . EYE SURGERY     to remove styes at age 45  . MYOMECTOMY  05/21/2017   laparoscopic - labor and vaginal delivery not recommended by [facility]  .  wisdom teeth Bilateral     Current Outpatient Medications  Medication Sig Dispense Refill  . acetaminophen (TYLENOL) 500 MG chewable tablet Chew 500 mg by mouth every 6 (six) hours as needed for pain.    . clobetasol ointment (TEMOVATE) AB-123456789 % Apply 1 application topically as needed.     . famciclovir (FAMVIR) 500 MG tablet Take two tablets (1000 mg) by mouth twice a day for one day as needed. 30 tablet 0  . metroNIDAZOLE (METROGEL) 0.75 % vaginal gel Place 1 Applicatorful vaginally 2 (two) times a week. 70 g 3  . triamcinolone ointment (KENALOG) 0.1 % Daily to eczema, once resolved, continue only on weekends to prevent recurrences     No current facility-administered medications for this visit.      ALLERGIES: Acyclovir and related, Ampicillin, Pollen extract, and Valtrex [valacyclovir]  Family History  Problem Relation Age of Onset  . Hypertension Mother   . Thyroid disease Mother   . Diabetes Father   . Hypertension Father   .  Diabetes Maternal Grandmother   . Hypertension Maternal Grandmother   . Stroke Maternal Grandmother   . Diabetes Maternal Grandfather   . Hypertension Maternal Grandfather   . Hypertension Paternal Grandmother   . Hypertension Paternal Grandfather     Social History   Socioeconomic History  . Marital status: Single    Spouse name: Not on file  . Number of children: Not on file  . Years of education: Not on file  . Highest education level: Not on file  Occupational History  . Not on file  Social Needs  . Financial resource strain: Not on file  . Food insecurity    Worry: Not on file    Inability: Not on file  . Transportation needs    Medical: Not on file    Non-medical: Not on file  Tobacco Use  . Smoking status: Never Smoker  . Smokeless tobacco: Never Used  Substance and Sexual Activity  . Alcohol use: Not Currently    Alcohol/week: 0.0 standard drinks  . Drug use: No  . Sexual activity: Yes    Partners: Male    Birth  control/protection: Abstinence    Comment: nuvaring  Lifestyle  . Physical activity    Days per week: Not on file    Minutes per session: Not on file  . Stress: Not on file  Relationships  . Social Herbalist on phone: Not on file    Gets together: Not on file    Attends religious service: Not on file    Active member of club or organization: Not on file    Attends meetings of clubs or organizations: Not on file    Relationship status: Not on file  . Intimate partner violence    Fear of current or ex partner: Not on file    Emotionally abused: Not on file    Physically abused: Not on file    Forced sexual activity: Not on file  Other Topics Concern  . Not on file  Social History Narrative  . Not on file    Review of Systems  All other systems reviewed and are negative.   PHYSICAL EXAMINATION:    BP 130/76 (Cuff Size: Large)   Pulse 70   Temp 97.7 F (36.5 C) (Temporal)   Ht 5' 2.5" (1.588 m)   Wt 191 lb 12.8 oz (87 kg)   LMP 08/18/2019 (Exact Date)   BMI 34.52 kg/m     General appearance: alert, cooperative and appears stated age   Uterus  No masses.  EMS 8.87 mm.  Normal ovaries.  No free fluid.  ASSESSMENT  Pelvic pain.  Normal pelvic US.  Iron deficiency.  Chronic vaginitis.  On Metrogel for suppression regimen.  PLAN  We reviewed reasons for pelvic pain - recent discontinuation of NuvaRing, endometriosis, bowel.  She declines estrogen free contraception to treat pain.  Not significant enough for her.  Restart iron treatment.  We discussed dietary choices to increase iron.  Continue Metrogel. Fu in 2 months for lab visit to check CBC, iron, and ferritin.  Return for signs of vaginitis.    An After Visit Summary was printed and given to the patient.  ___15___ minutes face to face time of which over 50% was spent in counseling.

## 2019-09-15 ENCOUNTER — Ambulatory Visit (INDEPENDENT_AMBULATORY_CARE_PROVIDER_SITE_OTHER): Payer: BC Managed Care – PPO | Admitting: Licensed Clinical Social Worker

## 2019-09-15 DIAGNOSIS — F324 Major depressive disorder, single episode, in partial remission: Secondary | ICD-10-CM

## 2019-10-13 ENCOUNTER — Ambulatory Visit (INDEPENDENT_AMBULATORY_CARE_PROVIDER_SITE_OTHER): Payer: BC Managed Care – PPO | Admitting: Licensed Clinical Social Worker

## 2019-10-13 DIAGNOSIS — F324 Major depressive disorder, single episode, in partial remission: Secondary | ICD-10-CM | POA: Diagnosis not present

## 2019-10-21 ENCOUNTER — Other Ambulatory Visit: Payer: Self-pay

## 2019-10-22 ENCOUNTER — Ambulatory Visit (INDEPENDENT_AMBULATORY_CARE_PROVIDER_SITE_OTHER): Payer: BC Managed Care – PPO | Admitting: Obstetrics and Gynecology

## 2019-10-22 ENCOUNTER — Encounter: Payer: Self-pay | Admitting: Obstetrics and Gynecology

## 2019-10-22 VITALS — BP 140/80 | HR 83 | Temp 98.1°F | Ht 63.0 in | Wt 191.6 lb

## 2019-10-22 DIAGNOSIS — N761 Subacute and chronic vaginitis: Secondary | ICD-10-CM | POA: Diagnosis not present

## 2019-10-22 DIAGNOSIS — Z113 Encounter for screening for infections with a predominantly sexual mode of transmission: Secondary | ICD-10-CM

## 2019-10-22 DIAGNOSIS — N898 Other specified noninflammatory disorders of vagina: Secondary | ICD-10-CM

## 2019-10-22 DIAGNOSIS — Z789 Other specified health status: Secondary | ICD-10-CM

## 2019-10-22 DIAGNOSIS — B9689 Other specified bacterial agents as the cause of diseases classified elsewhere: Secondary | ICD-10-CM

## 2019-10-22 DIAGNOSIS — N760 Acute vaginitis: Secondary | ICD-10-CM

## 2019-10-22 LAB — POCT URINALYSIS DIPSTICK
Bilirubin, UA: NEGATIVE
Blood, UA: NEGATIVE
Glucose, UA: NEGATIVE
Ketones, UA: NEGATIVE
Nitrite, UA: NEGATIVE
Protein, UA: NEGATIVE
Urobilinogen, UA: NEGATIVE E.U./dL — AB
pH, UA: 7 (ref 5.0–8.0)

## 2019-10-22 LAB — POCT URINE PREGNANCY: Preg Test, Ur: NEGATIVE

## 2019-10-22 MED ORDER — NORETHINDRONE 0.35 MG PO TABS: 1.0000 | ORAL_TABLET | Freq: Every day | ORAL | 0 refills | Status: DC

## 2019-10-22 MED ORDER — DOXYCYCLINE HYCLATE 100 MG PO CAPS: 100.0000 mg | ORAL_CAPSULE | Freq: Two times a day (BID) | ORAL | 0 refills | Status: DC

## 2019-10-22 NOTE — Progress Notes (Signed)
GYNECOLOGY  VISIT   HPI: 33 y.o.   Single  African American  female   G0P0 with Patient's last menstrual period was 10/06/2019.   here for   Abdominal pain.   Pain for a couple of weeks.  It is a gnawing pain in lower middle abdomen. It goes throughout the day, coming and going.  It can last for a couple of minutes. Exercising makes the pain better.  Nothing makes it worse. Sex is not painful.  No new partner.  Last intercourse was 4 days ago.  No relationship to her periods. She has a little cramping with her cycle. Menses every 25 days.  She had HA with NuvaRing.   She saw blood in her underwear, but thinks she cut herself shaving.   Hx myomectomy.  Normal pelvic US on 09/09/19.   Increased vaginal discharge yesterday.  No odor.   She is having regular BMs.  They occur at least every day.  No diarrhea.  No nausea or vomiting.  No fever.   No dysuria or hematuria.   GYNECOLOGIC HISTORY: Patient's last menstrual period was 10/06/2019. Contraception:  Condoms.  Menopausal hormone therapy:  no Last mammogram:  na Last pap smear:   09/30/18 normal        OB History    Gravida  0   Para      Term      Preterm      AB      Living        SAB      TAB      Ectopic      Multiple      Live Births                 Patient Active Problem List   Diagnosis Date Noted  . Menorrhagia 12/31/2018  . Iron deficiency anemia due to chronic blood loss 12/28/2018  . 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 Seborrheic dermatitis 03/04/2014    Past Medical History:  Diagnosis Date  . Dysmenorrhea   . Eczema    arms and neck  . Fibroid   . Heart murmur    dx'd as a teenager and told would outgrow  . 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  Marland Kitchen Hormone disorder    enlarged thyroid  . Iron deficiency   . Psoriasis of scalp   . Seasonal allergies   . STD (sexually transmitted disease)    Tx'd for Chlamydia and Gonorrhea age 70    Past Surgical  History:  Procedure Laterality Date  . EYE SURGERY     to remove styes at age 67  . MYOMECTOMY  05/21/2017   laparoscopic - labor and vaginal delivery not recommended by [facility]  . wisdom teeth Bilateral     Current Outpatient Medications  Medication Sig Dispense Refill  . acetaminophen (TYLENOL) 500 MG chewable tablet Chew 500 mg by mouth every 6 (six) hours as needed for pain.    . clobetasol ointment (TEMOVATE) AB-123456789 % Apply 1 application topically as needed.     . famciclovir (FAMVIR) 500 MG tablet Take two tablets (1000 mg) by mouth twice a day for one day as needed. 30 tablet 0  . triamcinolone ointment (KENALOG) 0.1 % Daily to eczema, once resolved, continue only on weekends to prevent recurrences     No current facility-administered medications for this visit.     ALLERGIES: Acyclovir and related, Ampicillin, Pollen extract, and Valtrex [valacyclovir]  Family  History  Problem Relation Age of Onset  . Hypertension Mother   . Thyroid disease Mother   . Diabetes Father   . Hypertension Father   . Diabetes Maternal Grandmother   . Hypertension Maternal Grandmother   . Stroke Maternal Grandmother   . Diabetes Maternal Grandfather   . Hypertension Maternal Grandfather   . Hypertension Paternal Grandmother   . Hypertension Paternal Grandfather     Social History   Socioeconomic History  . Marital status: Single    Spouse name: Not on file  . Number of children: Not on file  . Years of education: Not on file  . Highest education level: Not on file  Occupational History  . Not on file  Tobacco Use  . Smoking status: Never Smoker  . Smokeless tobacco: Never Used  Substance and Sexual Activity  . Alcohol use: Not Currently    Alcohol/week: 0.0 standard drinks  . Drug use: No  . Sexual activity: Yes    Partners: Male    Birth control/protection: Abstinence    Comment: nuvaring  Other Topics Concern  . Not on file  Social History Narrative  . Not on file   Social  Determinants of Health   Financial Resource Strain:   . Difficulty of Paying Living Expenses: Not on file  Food Insecurity:   . Worried About Charity fundraiser in the Last Year: Not on file  . Ran Out of Food in the Last Year: Not on file  Transportation Needs:   . Lack of Transportation (Medical): Not on file  . Lack of Transportation (Non-Medical): Not on file  Physical Activity:   . Days of Exercise per Week: Not on file  . Minutes of Exercise per Session: Not on file  Stress:   . Feeling of Stress : Not on file  Social Connections:   . Frequency of Communication with Friends and Family: Not on file  . Frequency of Social Gatherings with Friends and Family: Not on file  . Attends Religious Services: Not on file  . Active Member of Clubs or Organizations: Not on file  . Attends Archivist Meetings: Not on file  . Marital Status: Not on file  Intimate Partner Violence:   . Fear of Current or Ex-Partner: Not on file  . Emotionally Abused: Not on file  . Physically Abused: Not on file  . Sexually Abused: Not on file    Review of Systems  Genitourinary: Positive for pelvic pain.  All other systems reviewed and are negative.   PHYSICAL EXAMINATION:    BP 140/80   Pulse 83   Temp 98.1 F (36.7 C)   Ht 5\' 3"  (1.6 m)   Wt 191 lb 9.6 oz (86.9 kg)   LMP 10/06/2019   SpO2 95%   BMI 33.94 kg/m     General appearance: alert, cooperative and appears stated age  Pelvic: External genitalia:  no lesions              Urethra:  normal appearing urethra with no masses, tenderness or lesions              Bartholins and Skenes: normal                 Vagina: normal appearing vagina with normal color and discharge, no lesions.  Piece of condom noted in the vagina, removed, shown to patient, and discarded.              Cervix: no  lesions.  Mild CMT.  Mild uterine tenderness.  No adnexal tenderness.               Bimanual Exam:  Uterus:  normal size, contour, position,  consistency, mobility, non-tender              Adnexa: no mass, fullness, tenderness                Chaperone was present for exam.  ASSESSMENT  Elevated blood pressure. Anemia.  Condom retained/broken Endometritis.  Symptoms not quite consistent with PID. Hx myomectomy.   PLAN  We talked about potential cause for her pain - infection, scar tissue, endometriosis, constipation.  Vaginitis testing and GC/CT.  Will treat with Doxycycline 100 mg po bid x 7 days.  No Ceftriaxone given.   UPT.  Urine dip.  Start stool softener.  Micronor.  Instructed in use.  She will start with her next cycle.  Fu in 10 days.  Call if pain is progressive.    An After Visit Summary was printed and given to the patient.  __25____ minutes face to face time of which over 50% was spent in counseling.

## 2019-10-22 NOTE — Addendum Note (Signed)
Addended by: Yates Decamp on: 10/22/2019 10:38 AM   Modules accepted: Orders

## 2019-10-25 LAB — CERVICOVAGINAL ANCILLARY ONLY
Bacterial Vaginitis (gardnerella): POSITIVE — AB
Candida Glabrata: NEGATIVE
Candida Vaginitis: NEGATIVE
Chlamydia: NEGATIVE
Comment: NEGATIVE
Comment: NEGATIVE
Comment: NEGATIVE
Comment: NEGATIVE
Comment: NEGATIVE
Comment: NORMAL
Neisseria Gonorrhea: NEGATIVE
Trichomonas: NEGATIVE

## 2019-10-27 ENCOUNTER — Other Ambulatory Visit: Payer: Self-pay | Admitting: *Deleted

## 2019-10-27 MED ORDER — NONFORMULARY OR COMPOUNDED ITEM: 0 refills | Status: DC

## 2019-10-27 MED ORDER — METRONIDAZOLE 500 MG PO TABS: 500.0000 mg | ORAL_TABLET | Freq: Two times a day (BID) | ORAL | 0 refills | Status: DC

## 2019-11-03 ENCOUNTER — Other Ambulatory Visit: Payer: Self-pay

## 2019-11-04 ENCOUNTER — Ambulatory Visit: Payer: BC Managed Care – PPO | Admitting: Obstetrics and Gynecology

## 2019-11-04 ENCOUNTER — Encounter: Payer: Self-pay | Admitting: Obstetrics and Gynecology

## 2019-11-04 VITALS — BP 132/88 | HR 88 | Temp 97.2°F | Ht 62.5 in | Wt 188.4 lb

## 2019-11-04 DIAGNOSIS — R300 Dysuria: Secondary | ICD-10-CM

## 2019-11-04 DIAGNOSIS — N898 Other specified noninflammatory disorders of vagina: Secondary | ICD-10-CM

## 2019-11-04 DIAGNOSIS — N719 Inflammatory disease of uterus, unspecified: Secondary | ICD-10-CM

## 2019-11-04 MED ORDER — FLUCONAZOLE 150 MG PO TABS: 150.0000 mg | ORAL_TABLET | Freq: Once | ORAL | 0 refills | Status: AC

## 2019-11-04 NOTE — Progress Notes (Signed)
GYNECOLOGY  VISIT   HPI: 33 y.o.   Single  African American  female   G0P0 with Patient's last menstrual period was 10/30/2019 (exact date).   here for 1 week follow up.   Patient seen for abdominal pelvic pain on 10/22/19.  She had a portion of a retained condom.  She was treated for endometritis with Doxycycline 100 mg po bid x 7 days.  Her testing was negative for GC/CT. She did have BV dx at the time and was treated with Flagyl 500 mg po bid x 7 days with a plan for vaginal boric acid suppositories for one month.  She did not fill the boric acid prescription yet.  Received notification from the health department of her exposure to syphilis from her prior partner in October. Patient tested positive for syphilis and was treated with PCN.  She had negative testing for HIV.  She had intercourse with her current partner with a condom and he had not been evaluated or treated yet.  States her abdominal pain is now gone.  No more vaginal discharge.    Her period is finishing.  She is reporting some vaginal irritation and discomfort with urination after her examination is completed today.  GYNECOLOGIC HISTORY: Patient's last menstrual period was 10/30/2019 (exact date). Contraception: condoms Menopausal hormone therapy: no Last mammogram: n/a Last pap smear: 09-30-18 Neg:Neg HR HPV,07-24-15 Neg,        OB History    Gravida  0   Para      Term      Preterm      AB      Living        SAB      TAB      Ectopic      Multiple      Live Births                 Patient Active Problem List   Diagnosis Date Noted  . Menorrhagia 12/31/2018  . Iron deficiency anemia due to chronic blood loss 12/28/2018  . 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 Seborrheic dermatitis 03/04/2014    Past Medical History:  Diagnosis Date  . Dysmenorrhea   . Eczema    arms and neck  . Fibroid   . Heart murmur    dx'd as a teenager and told would outgrow  . Herpes  infection 2018   HSV II.  Marland Kitchen Hormone disorder    enlarged thyroid  . Iron deficiency   . Psoriasis of scalp   . Seasonal allergies   . STD (sexually transmitted disease)    Tx'd for Chlamydia and Gonorrhea age 89    Past Surgical History:  Procedure Laterality Date  . EYE SURGERY     to remove styes at age 37  . MYOMECTOMY  05/21/2017   laparoscopic - labor and vaginal delivery not recommended by [facility]  . wisdom teeth Bilateral     Current Outpatient Medications  Medication Sig Dispense Refill  . acetaminophen (TYLENOL) 500 MG chewable tablet Chew 500 mg by mouth every 6 (six) hours as needed for pain.    . clobetasol ointment (TEMOVATE) AB-123456789 % Apply 1 application topically as needed.     . doxycycline (VIBRAMYCIN) 100 MG capsule Take 1 capsule (100 mg total) by mouth 2 (two) times daily. Take for 7 days.  Take with food as can cause GI distress. 14 capsule 0  . famciclovir (FAMVIR) 500 MG tablet Take two tablets (  1000 mg) by mouth twice a day for one day as needed. 30 tablet 0  . metroNIDAZOLE (FLAGYL) 500 MG tablet Take 1 tablet (500 mg total) by mouth 2 (two) times daily. 14 tablet 0  . NONFORMULARY OR COMPOUNDED ITEM Boric Acid vaginal suppository 600 mg. Place one suppository vaginally at hs for 30 days. 30 each 0  . norethindrone (MICRONOR) 0.35 MG tablet Take 1 tablet (0.35 mg total) by mouth daily. 3 Package 0  . triamcinolone ointment (KENALOG) 0.1 % Daily to eczema, once resolved, continue only on weekends to prevent recurrences     No current facility-administered medications for this visit.     ALLERGIES: Acyclovir and related, Ampicillin, Pollen extract, and Valtrex [valacyclovir]  Family History  Problem Relation Age of Onset  . Hypertension Mother   . Thyroid disease Mother   . Diabetes Father   . Hypertension Father   . Diabetes Maternal Grandmother   . Hypertension Maternal Grandmother   . Stroke Maternal Grandmother   . Diabetes Maternal Grandfather   .  Hypertension Maternal Grandfather   . Hypertension Paternal Grandmother   . Hypertension Paternal Grandfather     Social History   Socioeconomic History  . Marital status: Single    Spouse name: Not on file  . Number of children: Not on file  . Years of education: Not on file  . Highest education level: Not on file  Occupational History  . Not on file  Tobacco Use  . Smoking status: Never Smoker  . Smokeless tobacco: Never Used  Substance and Sexual Activity  . Alcohol use: Not Currently    Alcohol/week: 0.0 standard drinks  . Drug use: No  . Sexual activity: Yes    Partners: Male    Birth control/protection: Abstinence    Comment: nuvaring  Other Topics Concern  . Not on file  Social History Narrative  . Not on file   Social Determinants of Health   Financial Resource Strain:   . Difficulty of Paying Living Expenses: Not on file  Food Insecurity:   . Worried About Charity fundraiser in the Last Year: Not on file  . Ran Out of Food in the Last Year: Not on file  Transportation Needs:   . Lack of Transportation (Medical): Not on file  . Lack of Transportation (Non-Medical): Not on file  Physical Activity:   . Days of Exercise per Week: Not on file  . Minutes of Exercise per Session: Not on file  Stress:   . Feeling of Stress : Not on file  Social Connections:   . Frequency of Communication with Friends and Family: Not on file  . Frequency of Social Gatherings with Friends and Family: Not on file  . Attends Religious Services: Not on file  . Active Member of Clubs or Organizations: Not on file  . Attends Archivist Meetings: Not on file  . Marital Status: Not on file  Intimate Partner Violence:   . Fear of Current or Ex-Partner: Not on file  . Emotionally Abused: Not on file  . Physically Abused: Not on file  . Sexually Abused: Not on file    Review of Systems  All other systems reviewed and are negative.   PHYSICAL EXAMINATION:    BP 132/88  (Cuff Size: Large)   Pulse 88   Temp (!) 97.2 F (36.2 C) (Temporal)   Ht 5' 2.5" (1.588 m)   Wt 188 lb 6.4 oz (85.5 kg)   LMP  10/30/2019 (Exact Date)   BMI 33.91 kg/m     General appearance: alert, cooperative and appears stated age  Pelvic: External genitalia:  no lesions              Urethra:  normal appearing urethra with no masses, tenderness or lesions              Bartholins and Skenes: normal                 Vagina: normal appearing vagina with normal color and discharge, no lesions              Cervix: no lesions.  Menstrual flow noted.                 Bimanual Exam:  Uterus:  normal size, contour, position, consistency, mobility, non-tender              Adnexa: no mass, fullness, tenderness             Chaperone was present for exam.  ASSESSMENT  Dysuria.  Recent dx of syphilis. Endometritis treated with doxycycline.  GC/CT both negative. Recent BV.  Vaginal irritation. Status post multiple abx.  PLAN  She will contact the health department for potential repeat PCN treatment.  Rx for Diflucan.  Will send UC.  I recommend she does not use the boric acid at this time.    An After Visit Summary was printed and given to the patient.  __20____ minutes face to face time of which over 50% was spent in counseling.

## 2019-11-04 NOTE — Addendum Note (Signed)
Addended by: Lowella Fairy on: 11/04/2019 03:13 PM   Modules accepted: Orders

## 2019-11-06 LAB — URINE CULTURE: Organism ID, Bacteria: NO GROWTH

## 2019-11-09 ENCOUNTER — Other Ambulatory Visit: Payer: BC Managed Care – PPO

## 2019-11-12 ENCOUNTER — Other Ambulatory Visit: Payer: BC Managed Care – PPO

## 2019-11-17 ENCOUNTER — Other Ambulatory Visit: Payer: Self-pay

## 2019-11-18 ENCOUNTER — Ambulatory Visit: Payer: BC Managed Care – PPO | Admitting: Licensed Clinical Social Worker

## 2019-11-19 ENCOUNTER — Other Ambulatory Visit: Payer: BC Managed Care – PPO

## 2019-11-22 ENCOUNTER — Telehealth: Payer: Self-pay | Admitting: Obstetrics and Gynecology

## 2019-11-22 ENCOUNTER — Encounter: Payer: Self-pay | Admitting: Obstetrics and Gynecology

## 2019-11-22 NOTE — Telephone Encounter (Signed)
Patient sent the following message through [place]. Routing to triage to assist patient with request.  [place], Xitlally  P [facility]  Phone Number: [phone]  Hi Dr. Quincy Simmonds,   I hope everything is going well. I know I just seen you November 04, 2019 but my years infection has resurfaced again (very uncomfortable). Is there anyway I can get a stronger or different yeast infection medication or should I just be seen again?   Thanks   Kimela

## 2019-11-22 NOTE — Telephone Encounter (Signed)
Spoke with patient. Patient reports vaginal itching, burning and white "cottage cheese" vaginal d/c that started over the weekend. Tried OTC monistat, not effective. Patient was seen in office on 12/18 and 12/18, tx for BV and endometritis.   Denies pelvic pain, vaginal odor, urinary symptoms, fever/chills. [id number] prescreen negative, precautions reviewed. OV scheduled for 1/20 at 2pm with Dr. Quincy Simmonds. Patient is agreeable to date and time.   Routing to provider for final review. Patient is agreeable to disposition. Will close encounter.

## 2019-11-24 ENCOUNTER — Other Ambulatory Visit: Payer: Self-pay | Admitting: *Deleted

## 2019-11-24 ENCOUNTER — Encounter: Payer: Self-pay | Admitting: Obstetrics and Gynecology

## 2019-11-24 ENCOUNTER — Other Ambulatory Visit: Payer: Self-pay

## 2019-11-24 ENCOUNTER — Ambulatory Visit: Payer: BC Managed Care – PPO | Admitting: Obstetrics and Gynecology

## 2019-11-24 VITALS — BP 120/70 | HR 80 | Temp 97.1°F | Ht 62.5 in | Wt 190.0 lb

## 2019-11-24 DIAGNOSIS — E611 Iron deficiency: Secondary | ICD-10-CM

## 2019-11-24 DIAGNOSIS — N760 Acute vaginitis: Secondary | ICD-10-CM | POA: Diagnosis not present

## 2019-11-24 DIAGNOSIS — D649 Anemia, unspecified: Secondary | ICD-10-CM

## 2019-11-24 NOTE — Progress Notes (Signed)
GYNECOLOGY  VISIT   HPI: 34 y.o.   Single  African American  female   G0P0 with Patient's last menstrual period was 11/23/2019 (exact date).   here for white "cottage cheese like " vaginal discharge.    Having a lot of itching.  Feels like this is different from [place].  She used Monistat 3 days ago.  She is feeling better today.   Patient states began cycle yesterday and symptoms slightly better today.  States she works out several days a week.   Denies pelvic pain.   She will also have some blood work today.   GYNECOLOGIC HISTORY: Patient's last menstrual period was 11/23/2019 (exact date). Contraception: condoms Menopausal hormone therapy:  none Last mammogram: n/a Last pap smear: 09-30-18 Neg:Neg HR HPV,07-24-15 Neg        OB History    Gravida  0   Para      Term      Preterm      AB      Living        SAB      TAB      Ectopic      Multiple      Live Births                 Patient Active Problem List   Diagnosis Date Noted  . Menorrhagia 12/31/2018  . Iron deficiency anemia due to chronic blood loss 12/28/2018  . 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 Seborrheic dermatitis 03/04/2014    Past Medical History:  Diagnosis Date  . Dysmenorrhea   . Eczema    arms and neck  . Fibroid   . Heart murmur    dx'd as a teenager and told would outgrow  . 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  Marland Kitchen Hormone disorder    enlarged thyroid  . Iron deficiency   . Psoriasis of scalp   . Seasonal allergies   . STD (sexually transmitted disease)    Tx'd for Chlamydia and Gonorrhea age 59  . Syphilis 2020    Past Surgical History:  Procedure Laterality Date  . EYE SURGERY     to remove styes at age 82  . MYOMECTOMY  05/21/2017   laparoscopic - labor and vaginal delivery not recommended by [facility]  . wisdom teeth Bilateral     Current Outpatient Medications  Medication Sig Dispense Refill  . acetaminophen (TYLENOL) 500 MG chewable tablet Chew 500  mg by mouth every 6 (six) hours as needed for pain.    . clobetasol ointment (TEMOVATE) AB-123456789 % Apply 1 application topically as needed.     . famciclovir (FAMVIR) 500 MG tablet Take two tablets (1000 mg) by mouth twice a day for one day as needed. 30 tablet 0  . norethindrone (MICRONOR) 0.35 MG tablet Take 1 tablet (0.35 mg total) by mouth daily. 3 Package 0  . triamcinolone ointment (KENALOG) 0.1 % Daily to eczema, once resolved, continue only on weekends to prevent recurrences    . NONFORMULARY OR COMPOUNDED ITEM Boric Acid vaginal suppository 600 mg. Place one suppository vaginally at hs for 30 days. (Patient not taking: Reported on 11/24/2019) 30 each 0   No current facility-administered medications for this visit.     ALLERGIES: Acyclovir and related, Ampicillin, Pollen extract, and Valtrex [valacyclovir]  Family History  Problem Relation Age of Onset  . Hypertension Mother   . Thyroid disease Mother   . Diabetes Father   .  Hypertension Father   . Diabetes Maternal Grandmother   . Hypertension Maternal Grandmother   . Stroke Maternal Grandmother   . Diabetes Maternal Grandfather   . Hypertension Maternal Grandfather   . Hypertension Paternal Grandmother   . Hypertension Paternal Grandfather     Social History   Socioeconomic History  . Marital status: Single    Spouse name: Not on file  . Number of children: Not on file  . Years of education: Not on file  . Highest education level: Not on file  Occupational History  . Not on file  Tobacco Use  . Smoking status: Never Smoker  . Smokeless tobacco: Never Used  Substance and Sexual Activity  . Alcohol use: Not Currently    Alcohol/week: 0.0 standard drinks  . Drug use: No  . Sexual activity: Yes    Partners: Male    Birth control/protection: Abstinence    Comment: nuvaring  Other Topics Concern  . Not on file  Social History Narrative  . Not on file   Social Determinants of Health   Financial Resource Strain:    . Difficulty of Paying Living Expenses: Not on file  Food Insecurity:   . Worried About Charity fundraiser in the Last Year: Not on file  . Ran Out of Food in the Last Year: Not on file  Transportation Needs:   . Lack of Transportation (Medical): Not on file  . Lack of Transportation (Non-Medical): Not on file  Physical Activity:   . Days of Exercise per Week: Not on file  . Minutes of Exercise per Session: Not on file  Stress:   . Feeling of Stress : Not on file  Social Connections:   . Frequency of Communication with Friends and Family: Not on file  . Frequency of Social Gatherings with Friends and Family: Not on file  . Attends Religious Services: Not on file  . Active Member of Clubs or Organizations: Not on file  . Attends Archivist Meetings: Not on file  . Marital Status: Not on file  Intimate Partner Violence:   . Fear of Current or Ex-Partner: Not on file  . Emotionally Abused: Not on file  . Physically Abused: Not on file  . Sexually Abused: Not on file    Review of Systems  All other systems reviewed and are negative.   PHYSICAL EXAMINATION:    BP 120/70   Pulse 80   Temp (!) 97.1 F (36.2 C) (Temporal)   Ht 5' 2.5" (1.588 m)   Wt 190 lb (86.2 kg)   LMP 11/23/2019 (Exact Date)   BMI 34.20 kg/m     General appearance: alert, cooperative and appears stated age   Pelvic: External genitalia:  no lesions              Urethra:  normal appearing urethra with no masses, tenderness or lesions              Bartholins and Skenes: normal                 Vagina: normal appearing vagina with normal color and discharge, no lesions              Cervix: no lesions.  Vaginal blood noted.                Bimanual Exam:  Uterus:  normal size, contour, position, consistency, mobility, non-tender              Adnexa:  no mass, fullness, tenderness           Chaperone was present for exam.  ASSESSMENT  Vaginitis -recurrent. Anemia.  PLAN  We discussed risk  factors for yeast infection - abx, elevated glucose, restrictive clothing producing increased heat. FU Affirm.  Diflucan if yeast is noted.  Check CBC, iron, and ferritin.    An After Visit Summary was printed and given to the patient.  __15____ minutes face to face time of which over 50% was spent in counseling.

## 2019-11-24 NOTE — Addendum Note (Signed)
Addended by: Lowella Fairy on: 11/24/2019 03:28 PM   Modules accepted: Orders

## 2019-11-25 LAB — CBC
Hematocrit: 34.4 % (ref 34.0–46.6)
Hemoglobin: 11.1 g/dL (ref 11.1–15.9)
MCH: 25.9 pg — ABNORMAL LOW (ref 26.6–33.0)
MCHC: 32.3 g/dL (ref 31.5–35.7)
MCV: 80 fL (ref 79–97)
Platelets: 310 10*3/uL (ref 150–450)
RBC: 4.28 x10E6/uL (ref 3.77–5.28)
RDW: 14.5 % (ref 11.7–15.4)
WBC: 5.2 10*3/uL (ref 3.4–10.8)

## 2019-11-25 LAB — IRON: Iron: 32 ug/dL (ref 27–159)

## 2019-11-25 LAB — FERRITIN: Ferritin: 9 ng/mL — ABNORMAL LOW (ref 15–150)

## 2019-11-25 LAB — VAGINITIS/VAGINOSIS, DNA PROBE
Candida Species: NEGATIVE
Gardnerella vaginalis: NEGATIVE
Trichomonas vaginosis: NEGATIVE

## 2019-12-27 ENCOUNTER — Inpatient Hospital Stay: Payer: BC Managed Care – PPO | Attending: Hematology

## 2019-12-27 ENCOUNTER — Inpatient Hospital Stay: Payer: BC Managed Care – PPO

## 2019-12-27 ENCOUNTER — Inpatient Hospital Stay: Payer: BC Managed Care – PPO | Admitting: Hematology

## 2020-01-06 ENCOUNTER — Other Ambulatory Visit: Payer: Self-pay | Admitting: Obstetrics and Gynecology

## 2020-01-17 ENCOUNTER — Other Ambulatory Visit: Payer: Self-pay

## 2020-01-17 NOTE — Progress Notes (Signed)
GYNECOLOGY  VISIT   HPI: 34 y.o.   Single  African American  female   G0P0 with Patient's last menstrual period was 01/01/2020 (exact date).   here for vaginal burning/itching and "cottage cheese-like" discharge.    Since she received PCN in December for syphilis, she has had yeast problems.  She denies odor.  She has used Monistat, but thinks she needs something stronger.  She did a 3 day treatment, and last dose was yesterday.   Using [employer].   No no partner.   She was seen at [place] on 12/17/19 for vaginitis.  She was treated with probiotics.  Wet prep was neg for BV, yeast and trichomonas. Her hemoglobin A1C was 6.0.  Her RPR was reactive.  Her RPR titer was 1:2. (She has tested positive for syphilis in past.) GC/CT - neg/neg.  She tested negative for HIV 06/18/19 and early December, 2020 and at [facility] 10/27/19.   She has chronic vaginitis which is usually BV.   Strong DM family history.   She does exercise 3 - 4 times per week.   GYNECOLOGIC HISTORY: Patient's last menstrual period was 01/01/2020 (exact date). Contraception:  condoms Menopausal hormone therapy:  none Last mammogram:  n/a Last pap smear: 09-30-18 Neg:Neg HR HPV,07-24-15 Neg        OB History    Gravida  0   Para      Term      Preterm      AB      Living        SAB      TAB      Ectopic      Multiple      Live Births                 Patient Active Problem List   Diagnosis Date Noted  . Menorrhagia 12/31/2018  . Iron deficiency anemia due to chronic blood loss 12/28/2018  . 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 Seborrheic dermatitis 03/04/2014    Past Medical History:  Diagnosis Date  . Dysmenorrhea   . Eczema    arms and neck  . Fibroid   . Heart murmur    dx'd as a teenager and told would outgrow  . 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  Marland Kitchen Hormone disorder    enlarged thyroid  . Iron deficiency    . Psoriasis of scalp   . Seasonal allergies   . STD (sexually transmitted disease)    Tx'd for Chlamydia and Gonorrhea age 32  . Syphilis 2020  . Syphilis 2020    Past Surgical History:  Procedure Laterality Date  . EYE SURGERY     to remove styes at age 89  . MYOMECTOMY  05/21/2017   laparoscopic - labor and vaginal delivery not recommended by [facility]  . wisdom teeth Bilateral     Current Outpatient Medications  Medication Sig Dispense Refill  . acetaminophen (TYLENOL) 500 MG chewable tablet Chew 500 mg by mouth every 6 (six) hours as needed for pain.    . clobetasol ointment (TEMOVATE) [id number] % Apply 1 application topically as needed.     . famciclovir (FAMVIR) 500 MG tablet Take two tablets (1000 mg) by mouth twice a day for one day as needed. 30 tablet 0  . FLORASTOR 250 MG capsule Take 250 mg by mouth at bedtime.    . triamcinolone ointment (KENALOG) 0.1 % Daily to  eczema, once resolved, continue only on weekends to prevent recurrences    . NONFORMULARY OR COMPOUNDED ITEM Boric Acid vaginal suppository 600 mg. Place one suppository vaginally at hs for 30 days. (Patient not taking: Reported on 11/24/2019) 30 each 0   No current facility-administered medications for this visit.     ALLERGIES: Acyclovir and related, Lactose intolerance (gi), Ampicillin, Pollen extract, and Valtrex [valacyclovir]  Family History  Problem Relation Age of Onset  . Hypertension Mother   . Thyroid disease Mother   . Diabetes Father   . Hypertension Father   . Diabetes Maternal Grandmother   . Hypertension Maternal Grandmother   . Stroke Maternal Grandmother   . Diabetes Maternal Grandfather   . Hypertension Maternal Grandfather   . Hypertension Paternal Grandmother   . Hypertension Paternal Grandfather     Social History   Socioeconomic History  . Marital status: Single    Spouse name: Not on file  . Number of children: Not on file  . Years of education: Not on file  . Highest education  level: Not on file  Occupational History  . Not on file  Tobacco Use  . Smoking status: Never Smoker  . Smokeless tobacco: Never Used  Substance and Sexual Activity  . Alcohol use: Not Currently    Alcohol/week: 0.0 standard drinks  . Drug use: No  . Sexual activity: Yes    Partners: Male    Birth control/protection: Abstinence    Comment: nuvaring  Other Topics Concern  . Not on file  Social History Narrative  . Not on file   Social Determinants of Health   Financial Resource Strain:   . Difficulty of Paying Living Expenses:   Food Insecurity:   . Worried About Charity fundraiser in the Last Year:   . Arboriculturist in the Last Year:   Transportation Needs:   . Film/video editor (Medical):   Marland Kitchen Lack of Transportation (Non-Medical):   Physical Activity:   . Days of Exercise per Week:   . Minutes of Exercise per Session:   Stress:   . Feeling of Stress :   Social Connections:   . Frequency of Communication with Friends and Family:   . Frequency of Social Gatherings with Friends and Family:   . Attends Religious Services:   . Active Member of Clubs or Organizations:   . Attends Archivist Meetings:   Marland Kitchen Marital Status:   Intimate Partner Violence:   . Fear of Current or Ex-Partner:   . Emotionally Abused:   Marland Kitchen Physically Abused:   . Sexually Abused:     Review of Systems  All other systems reviewed and are negative.   PHYSICAL EXAMINATION:    BP 138/88 (Cuff Size: Large)   Pulse 60   Temp (!) 97.3 F (36.3 C) (Temporal)   Ht 5' 2.5" (1.588 m)   Wt 189 lb 9.6 oz (86 kg)   LMP 01/01/2020 (Exact Date)   BMI 34.13 kg/m     General appearance: alert, cooperative and appears stated age  Pelvic: External genitalia:  no lesions              Urethra:  normal appearing urethra with no masses, tenderness or lesions              Bartholins and Skenes: normal                 Vagina: normal appearing vagina with normal color and white  clumpy discharge  and cream noted, no lesions              Cervix: no lesions                Bimanual Exam:  Uterus:  normal size, contour, position, consistency, mobility, non-tender              Adnexa: no mass, fullness, tenderness                Chaperone was present for exam.  ASSESSMENT  Chronic vaginitis.  Usually BV.  Elevated hemoglobin A1C.  Prediabetes.  FH diabetes.  PLAN  We discussed risk factors for yeast infection.  We reviewed prediabetes and diabetes in detail including medical complications from diabetes.  Eating plan reviewed.  I recommended continuing regular vigorous physical education.  I set up her consultation for diabetes and nutrition counseling.  She will follow up with her Family Medicine office at [facility]. Affirm.  Final plan to follow vaginitis testing.  I did give her an Rx for Mycolog II.  FU for annual plan and prn.    An After Visit Summary was printed and given to the patient.  _30_____ minutes face to face time of which over 50% was spent in counseling.

## 2020-01-18 ENCOUNTER — Encounter: Payer: Self-pay | Admitting: Obstetrics and Gynecology

## 2020-01-18 ENCOUNTER — Ambulatory Visit: Payer: BC Managed Care – PPO | Admitting: Obstetrics and Gynecology

## 2020-01-18 VITALS — BP 138/88 | HR 60 | Temp 97.3°F | Ht 62.5 in | Wt 189.6 lb

## 2020-01-18 DIAGNOSIS — R7309 Other abnormal glucose: Secondary | ICD-10-CM | POA: Diagnosis not present

## 2020-01-18 DIAGNOSIS — N761 Subacute and chronic vaginitis: Secondary | ICD-10-CM

## 2020-01-18 MED ORDER — NYSTATIN-TRIAMCINOLONE 100000-0.1 UNIT/GM-% EX CREA: 1.0000 "application " | TOPICAL_CREAM | Freq: Two times a day (BID) | CUTANEOUS | 0 refills | Status: DC

## 2020-01-18 NOTE — Addendum Note (Signed)
Addended by: Lowella Fairy on: 01/18/2020 09:54 AM   Modules accepted: Orders

## 2020-01-18 NOTE — Patient Instructions (Signed)
Preventing Type 2 Diabetes Mellitus Type 2 diabetes (type 2 diabetes mellitus) is a long-term (chronic) disease that affects blood sugar (glucose) levels. Normally, a hormone called insulin allows glucose to enter cells in the body. The cells use glucose for energy. In type 2 diabetes, one or both of these problems may be present:  The body does not make enough insulin.  The body does not respond properly to insulin that it makes (insulin resistance). Insulin resistance or lack of insulin causes excess glucose to build up in the blood instead of going into cells. As a result, high blood glucose (hyperglycemia) develops, which can cause many complications. Being overweight or obese and having an inactive (sedentary) lifestyle can increase your risk for diabetes. Type 2 diabetes can be delayed or prevented by making certain nutrition and lifestyle changes. What nutrition changes can be made?   Eat healthy meals and snacks regularly. Keep a healthy snack with you for when you get hungry between meals, such as fruit or a handful of nuts.  Eat lean meats and proteins that are low in saturated fats, such as chicken, fish, egg whites, and beans. Avoid processed meats.  Eat plenty of fruits and vegetables and plenty of grains that have not been processed (whole grains). It is recommended that you eat: ? 1?2 cups of fruit every day. ? 2?3 cups of vegetables every day. ? 6?8 oz of whole grains every day, such as oats, whole wheat, bulgur, brown rice, quinoa, and millet.  Eat low-fat dairy products, such as milk, yogurt, and cheese.  Eat foods that contain healthy fats, such as nuts, avocado, olive oil, and canola oil.  Drink water throughout the day. Avoid drinks that contain added sugar, such as soda or sweet tea.  Follow instructions from your health care provider about specific eating or drinking restrictions.  Control how much food you eat at a time (portion size). ? Check food labels to find  out the serving sizes of foods. ? Use a kitchen scale to weigh amounts of foods.  Saute or steam food instead of frying it. Cook with water or broth instead of oils or butter.  Limit your intake of: ? Salt (sodium). Have no more than 1 tsp (2,400 mg) of sodium a day. If you have heart disease or high blood pressure, have less than ? tsp (1,500 mg) of sodium a day. ? Saturated fat. This is fat that is solid at room temperature, such as butter or fat on meat. What lifestyle changes can be made? Activity   Do moderate-intensity physical activity for at least 30 minutes on at least 5 days of the week, or as much as told by your health care provider.  Ask your health care provider what activities are safe for you. A mix of physical activities may be best, such as walking, swimming, cycling, and strength training.  Try to add physical activity into your day. For example: ? Park in spots that are farther away than usual, so that you walk more. For example, park in a far corner of the parking lot when you go to the office or the grocery store. ? Take a walk during your lunch break. ? Use stairs instead of elevators or escalators. Weight Loss  Lose weight as directed. Your health care provider can determine how much weight loss is best for you and can help you lose weight safely.  If you are overweight or obese, you may be instructed to lose at least 5?7 %   of your body weight. Alcohol and Tobacco   Limit alcohol intake to no more than 1 drink a day for nonpregnant women and 2 drinks a day for men. One drink equals 12 oz of beer, 5 oz of wine, or 1 oz of hard liquor.  Do not use any tobacco products, such as cigarettes, chewing tobacco, and e-cigarettes. If you need help quitting, ask your health care provider. Work With [place] Provider  Have your blood glucose tested regularly, as told by your health care provider.  Discuss your risk factors and how you can reduce your risk for  diabetes.  Get screening tests as told by your health care provider. You may have screening tests regularly, especially if you have certain risk factors for type 2 diabetes.  Make an appointment with a diet and nutrition specialist (registered dietitian). A registered dietitian can help you make a healthy eating plan and can help you understand portion sizes and food labels. Why are these changes important?  It is possible to prevent or delay type 2 diabetes and related health problems by making lifestyle and nutrition changes.  It can be difficult to recognize signs of type 2 diabetes. The best way to avoid possible damage to your body is to take actions to prevent the disease before you develop symptoms. What can happen if changes are not made?  Your blood glucose levels may keep increasing. Having high blood glucose for a long time is dangerous. Too much glucose in your blood can damage your blood vessels, heart, kidneys, nerves, and eyes.  You may develop prediabetes or type 2 diabetes. Type 2 diabetes can lead to many chronic health problems and complications, such as: ? Heart disease. ? Stroke. ? Blindness. ? Kidney disease. ? Depression. ? Poor circulation in the feet and legs, which could lead to surgical removal (amputation) in severe cases. Where to find support  Ask your health care provider to recommend a registered dietitian, diabetes educator, or weight loss program.  Look for local or online weight loss groups.  Join a gym, fitness club, or outdoor activity group, such as a walking club. Where to find more information To learn more about diabetes and diabetes prevention, visit:  American Diabetes Association (ADA): www.diabetes.[employer] of Diabetes and Digestive and Kidney Diseases: FindSpin.nl To learn more about healthy eating, visit:  The U.S. Department of Agriculture Scientist, research (physical sciences)), Choose My Plate:  http://wiley-williams.com/  Office of Disease Prevention and Health Promotion (ODPHP), Dietary Guidelines: SurferLive.at Summary  You can reduce your risk for type 2 diabetes by increasing your physical activity, eating healthy foods, and losing weight as directed.  Talk with your health care provider about your risk for type 2 diabetes. Ask about any blood tests or screening tests that you need to have. This information is not intended to replace advice given to you by your health care provider. Make sure you discuss any questions you have with your health care provider. Document Revised: 02/12/2019 Document Reviewed: 12/12/2015 Elsevier Patient Education  [place]. Prediabetes Prediabetes is the condition of having a blood sugar (blood glucose) level that is higher than it should be, but not high enough for you to be diagnosed with type 2 diabetes. Having prediabetes puts you at risk for developing type 2 diabetes (type 2 diabetes mellitus). Prediabetes may be called impaired glucose tolerance or impaired fasting glucose. Prediabetes usually does not cause symptoms. Your health care provider can diagnose this condition with blood tests. You may  be tested for prediabetes if you are overweight and if you have at least one other risk factor for prediabetes. What is blood glucose, and how is it measured? Blood glucose refers to the amount of glucose in your bloodstream. Glucose comes from eating foods that contain sugars and starches (carbohydrates), which the body breaks down into glucose. Your blood glucose level may be measured in mg/dL (milligrams per deciliter) or mmol/L (millimoles per liter). Your blood glucose may be checked with one or more of the following blood tests:  A fasting blood glucose (FBG) test. You will not be allowed to eat (you will fast) for 8 hours or longer before a blood sample is taken. ? A normal range for FBG is 70-100 mg/dl (3.9-5.6  mmol/L).  An A1c (hemoglobin A1c) blood test. This test provides information about blood glucose control over the previous 2?15months.  An oral glucose tolerance test (OGTT). This test measures your blood glucose at two times: ? After fasting. This is your baseline level. ? Two hours after you drink a beverage that contains glucose. You may be diagnosed with prediabetes:  If your FBG is 100?125 mg/dL (5.6-6.9 mmol/L).  If your A1c level is 5.7?6.4%.  If your OGTT result is 140?199 mg/dL (7.8-11 mmol/L). These blood tests may be repeated to confirm your diagnosis. How can this condition affect me? The pancreas produces a hormone (insulin) that helps to move glucose from the bloodstream into cells. When cells in the body do not respond properly to insulin that the body makes (insulin resistance), excess glucose builds up in the blood instead of going into cells. As a result, high blood glucose (hyperglycemia) can develop, which can cause many complications. Hyperglycemia is a symptom of prediabetes. Having high blood glucose for a long time is dangerous. Too much glucose in your blood can damage your nerves and blood vessels. Long-term damage can lead to complications from diabetes, which may include:  Heart disease.  Stroke.  Blindness.  Kidney disease.  Depression.  Poor circulation in the feet and legs, which could lead to surgical removal (amputation) in severe cases. What can increase my risk? Risk factors for prediabetes include:  Having a family member with type 2 diabetes.  Being overweight or obese.  Being older than age 46.  Being of American [place], African-American, Hispanic/Latino, or Asian/Pacific Islander descent.  Having an inactive (sedentary) lifestyle.  Having a history of heart disease.  History of gestational diabetes or polycystic ovary syndrome (PCOS), in women.  Having low levels of good cholesterol (HDL-C) or high levels of blood fats  (triglycerides).  Having high blood pressure. What actions can I take to prevent diabetes?      Be physically active. ? Do moderate-intensity physical activity for 30 or more minutes on 5 or more days of the week, or as much as told by your health care provider. This could be brisk walking, biking, or water aerobics. ? Ask your health care provider what activities are safe for you. A mix of physical activities may be best, such as walking, swimming, cycling, and strength training.  Lose weight as told by your health care provider. ? Losing 5-7% of your body weight can reverse insulin resistance. ? Your health care provider can determine how much weight loss is best for you and can help you lose weight safely.  Follow a healthy meal plan. This includes eating lean proteins, complex carbohydrates, fresh fruits and vegetables, low-fat dairy products, and healthy fats. ? Follow instructions from your  health care provider about eating or drinking restrictions. ? Make an appointment to see a diet and nutrition specialist (registered dietitian) to help you create a healthy eating plan that is right for you.  Do not smoke or use any tobacco products, such as cigarettes, chewing tobacco, and e-cigarettes. If you need help quitting, ask your health care provider.  Take over-the-counter and prescription medicines as told by your health care provider. You may be prescribed medicines that help lower the risk of type 2 diabetes.  Keep all follow-up visits as told by your health care provider. This is important. Summary  Prediabetes is the condition of having a blood sugar (blood glucose) level that is higher than it should be, but not high enough for you to be diagnosed with type 2 diabetes.  Having prediabetes puts you at risk for developing type 2 diabetes (type 2 diabetes mellitus).  To help prevent type 2 diabetes, make lifestyle changes such as being physically active and eating a healthy diet.  Lose weight as told by your health care provider. This information is not intended to replace advice given to you by your health care provider. Make sure you discuss any questions you have with your health care provider. Document Revised: 02/12/2019 Document Reviewed: 12/12/2015 Elsevier Patient Education  [place].

## 2020-01-19 LAB — VAGINITIS/VAGINOSIS, DNA PROBE
Candida Species: POSITIVE — AB
Gardnerella vaginalis: NEGATIVE
Trichomonas vaginosis: NEGATIVE

## 2020-01-20 ENCOUNTER — Telehealth: Payer: Self-pay

## 2020-01-20 ENCOUNTER — Other Ambulatory Visit: Payer: Self-pay | Admitting: *Deleted

## 2020-01-20 DIAGNOSIS — R7309 Other abnormal glucose: Secondary | ICD-10-CM

## 2020-01-20 DIAGNOSIS — R7303 Prediabetes: Secondary | ICD-10-CM

## 2020-01-20 MED ORDER — FLUCONAZOLE 150 MG PO TABS: ORAL_TABLET | ORAL | 0 refills | Status: DC

## 2020-01-20 NOTE — Telephone Encounter (Signed)
-----   Message from Nunzio Cobbs, MD sent at 01/20/2020  9:56 AM EDT ----- Please contact patient with results of Affirm showing yeast.  Please send Rx to her pharmacy. She needs Diflucan 150 mg po x 1.  May repeat in 72 hours prn.  Disp:  2 RF:  none.   I am highlighting this result so you know to contact the patient.

## 2020-01-20 NOTE — Telephone Encounter (Signed)
Spoke with patient and notified of positive yeast on Affirm testing. Will send Rx for Diflucan per Dr.Silva's note below. Verified pharmacy on file.

## 2020-01-21 ENCOUNTER — Encounter: Payer: Self-pay | Admitting: Certified Nurse Midwife

## 2020-02-03 ENCOUNTER — Ambulatory Visit: Payer: BC Managed Care – PPO | Attending: Internal Medicine

## 2020-02-03 DIAGNOSIS — Z23 Encounter for immunization: Secondary | ICD-10-CM

## 2020-02-03 NOTE — Progress Notes (Signed)
   Covid-19 Vaccination Clinic  Name:  Allison Bentley    MRN: [id number] DOB: [birth year]  02/03/2020  [name] was observed post Covid-19 immunization for 15 minutes without incident. She was provided with Vaccine Information Sheet and instruction to access the V-Safe system.   [name] was instructed to call 911 with any severe reactions post vaccine: Marland Kitchen Difficulty breathing  . Swelling of face and throat  . A fast heartbeat  . A bad rash all over body  . Dizziness and weakness   Immunizations Administered    Name Date Dose VIS Date Route   Pfizer COVID-19 Vaccine 02/03/2020  1:00 PM 0.3 mL 10/15/2019 Intramuscular   Manufacturer: [place]   Lot: [id number]   [place]: [id number]

## 2020-02-23 ENCOUNTER — Encounter: Payer: Self-pay | Admitting: Skilled Nursing Facility1

## 2020-02-23 ENCOUNTER — Encounter: Payer: BC Managed Care – PPO | Attending: Obstetrics and Gynecology | Admitting: Skilled Nursing Facility1

## 2020-02-23 ENCOUNTER — Other Ambulatory Visit: Payer: Self-pay

## 2020-02-23 DIAGNOSIS — E669 Obesity, unspecified: Secondary | ICD-10-CM | POA: Diagnosis not present

## 2020-02-23 NOTE — Patient Instructions (Signed)
-  Limit fast food consumption to eating out with coworkers   -Production assistant, radio every 2 weeks   -

## 2020-02-23 NOTE — Progress Notes (Signed)
  Assessment:  Primary concerns today:  prediabetes  Pt states she is allergic to apples and grape jelly.  Pt states she is taking a multi and probiotic.  Pt states she has had 3 yeast infections since December which is unusual for her.  Pt states her A1C 6.1 Pt states she would like to lose weight. Pt states her current weight is not her usual weight stating 155 pounds is her usual stating she is currently 185-190 pounds. Pt states she does not have a stress relief mechanisms. Pt states she feel she has had no Control no family reunions and no travel which has really caused a lot of stress for her but not really realizing how overwhelmed she was feeling stating she is open to finding a another therapist since hers retired.  Pt states she thinks she will Try out tennis at the [facility] courts.  Pt states she has a bowel movement daily and sleeps about 8 hours a night Pt states she is really worried to have prediabetes and has a strong family of diabetes.   MEDICATIONS: see list   DIETARY INTAKE:  Usual eating pattern includes 3 meals and 1 snacks per day.  Everyday foods include none stated.  Avoided foods include none stated   24-hr recall: 80+% eating out B ( AM): chic fila and yogurt Snk ( AM):  L ( PM): frozen meal Snk ( PM): crackers and cookies D ( PM): fast food  Snk ( PM): ice cream Beverages: sweet tea, water, gatorade   Usual physical activity: 3 days a week 45 minutes   Estimated energy needs: 1500 calories     Intervention:  Nutrition counseling. Dietitian educated pt on balanced meals, emotional eating with in the context weight management.  Goals: -Limit fast food consumption to eating out with coworkers  -Production assistant, radio every 2 weeks -Create balanced meals   Teaching Method Utilized:  Ship broker Hands on  Handouts given during visit include:  Detailed myplate  Barriers to learning/adherence to lifestyle change: emotional eating  Demonstrated  degree of understanding via:  Teach Back   Monitoring/Evaluation:  Dietary intake, exercise, and body weight prn.

## 2020-02-24 ENCOUNTER — Encounter: Payer: Self-pay | Admitting: Obstetrics and Gynecology

## 2020-02-28 ENCOUNTER — Ambulatory Visit: Payer: BC Managed Care – PPO | Attending: Internal Medicine

## 2020-02-28 DIAGNOSIS — Z23 Encounter for immunization: Secondary | ICD-10-CM

## 2020-02-28 NOTE — Progress Notes (Signed)
   Covid-19 Vaccination Clinic  Name:  Garrie Gillian    MRN: [id number] DOB: Apr 15, 1986  02/28/2020  Ms. Stefanik was observed post Covid-19 immunization for 15 minutes without incident. She was provided with Vaccine Information Sheet and instruction to access the V-Safe system.   Ms. Harshbarger was instructed to call 911 with any severe reactions post vaccine: Marland Kitchen Difficulty breathing  . Swelling of face and throat  . A fast heartbeat  . A bad rash all over body  . Dizziness and weakness   Immunizations Administered    Name Date Dose VIS Date Route   Pfizer COVID-19 Vaccine 02/28/2020  4:33 PM 0.3 mL 12/29/2018 Intramuscular   Manufacturer: [place]   Lot: [id number]   [place]: [id number]

## 2020-03-15 ENCOUNTER — Ambulatory Visit: Payer: BC Managed Care – PPO | Admitting: Skilled Nursing Facility1

## 2020-04-04 ENCOUNTER — Encounter: Payer: BC Managed Care – PPO | Attending: Obstetrics and Gynecology | Admitting: Skilled Nursing Facility1

## 2020-04-04 ENCOUNTER — Other Ambulatory Visit: Payer: Self-pay

## 2020-04-04 DIAGNOSIS — E669 Obesity, unspecified: Secondary | ICD-10-CM | POA: Diagnosis not present

## 2020-04-04 NOTE — Progress Notes (Signed)
  Assessment:  Primary concerns today:  prediabetes  Pt states she has not tried any tennis but wants to take lessons. Pt states he has tried to get unsweet tea instead of sweet tea. Pt states she has tried kefir with no issue. Pt states she feels she could do better with not eating out as much. Pt states she still has the plan of grocery shopping more. Pt states she has used money to motivate herself to not eat out. Pt expressed interest in keto diet: Dietitian advised if pt wants to follow this diet she would also need to cut out calorically dense foods not just complex carbohydrates.   MEDICATIONS: see list   DIETARY INTAKE:  Usual eating pattern includes 3 meals and 1 snacks per day.  Everyday foods include none stated.  Avoided foods include none stated   24-hr recall: 60+% eating out B ( AM): banana or yogurt + granola or chic fila or fried egg with toast with peanut butter and banana and chicken sausage  Snk ( AM):  L ( PM): frozen meal or hamburger  Snk ( PM): crackers and cookies D ( PM): fast food or salmon with spinach  Snk ( PM): ice cream Beverages: unsweet tea, water, gatorade   Usual physical activity: 3 days a week 45 minutes   Estimated energy needs: 1500 calories     Intervention:  Nutrition counseling. Dietitian educated pt on balanced meals, emotional eating with in the context weight management as well as macronutrient distribution and the physiologic importance of complex carbohydrates.  Counseled pt on limiting snacking throughout the day on calorically dense foods not focusing on limiting nutrient dense foods. Goals: -Limit fast food consumption to eating out with coworkers only -Grocery shop every 2 weeks -Continue: Create balanced meals -limit snacking not complex carbohydrates with meals  Teaching Method Utilized:  Ship broker Hands on  Handouts given during visit include:  Detailed myplate  Barriers to learning/adherence to lifestyle change:  emotional eating  Demonstrated degree of understanding via:  Teach Back   Monitoring/Evaluation:  Dietary intake, exercise, and body weight prn.

## 2020-04-06 DIAGNOSIS — Z8619 Personal history of other infectious and parasitic diseases: Secondary | ICD-10-CM | POA: Insufficient documentation

## 2020-05-10 ENCOUNTER — Other Ambulatory Visit: Payer: Self-pay

## 2020-05-10 MED ORDER — FAMCICLOVIR 500 MG PO TABS: ORAL_TABLET | ORAL | 0 refills | Status: DC

## 2020-05-10 NOTE — Telephone Encounter (Signed)
Patient is calling for refill of Famvir

## 2020-05-10 NOTE — Telephone Encounter (Signed)
Medication refill request: Famvir Last AEX:  09/01/19 Dr. Quincy Simmonds Next AEX: 09/06/20 Last MMG (if hormonal medication request): n/a Refill authorized: Today, please advise

## 2020-05-29 ENCOUNTER — Telehealth: Payer: Self-pay

## 2020-05-29 NOTE — Telephone Encounter (Signed)
Spoke with patient. Patient reports grey, thin vaginal d/c with odor. Symptoms started on 7/24. Denies itching, pain, fever/chills, irregular bleeding, urinary symptoms. LMP 05/20/20. Requesting OV. OV scheduled for 7/29 at 8:30am with Dr. Quincy Simmonds. Patient verbalizes understanding and is agreeable.   Last AEX 09/01/19  Routing to provider for final review. Patient is agreeable to disposition. Will close encounter.

## 2020-05-29 NOTE — Telephone Encounter (Signed)
Patient is calling in regards to bacterial vaginosis. Offered patient next available (06/12/20). Patient wants to be seen sooner.

## 2020-05-31 ENCOUNTER — Telehealth: Payer: Self-pay | Admitting: Obstetrics and Gynecology

## 2020-05-31 NOTE — Telephone Encounter (Signed)
Patient canceled her appointment to tomorrow for vaganitis. She was able to see her PCP and no longer needed the appointment.

## 2020-05-31 NOTE — Progress Notes (Deleted)
GYNECOLOGY  VISIT   HPI: 34 y.o.   Single  African American  female   G0P0 with No LMP recorded.   here for     GYNECOLOGIC HISTORY: No LMP recorded. Contraception:  ***condoms Menopausal hormone therapy:  none Last mammogram: n/a Last pap smear: 09-30-18 Neg:Neg HR HPV,07-24-15 Neg        OB History    Gravida  0   Para      Term      Preterm      AB      Living        SAB      TAB      Ectopic      Multiple      Live Births                 Patient Active Problem List   Diagnosis Date Noted  . Menorrhagia 12/31/2018  . Iron deficiency anemia due to chronic blood loss 12/28/2018  . 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 Seborrheic dermatitis 03/04/2014    Past Medical History:  Diagnosis Date  . Dysmenorrhea   . Eczema    arms and neck  . Elevated hemoglobin A1c   . Fibroid   . Heart murmur    dx'd as a teenager and told would outgrow  . 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  Marland Kitchen Hormone disorder    enlarged thyroid  . Iron deficiency   . Psoriasis of scalp   . Seasonal allergies   . STD (sexually transmitted disease)    Tx'd for Chlamydia and Gonorrhea age 89  . Syphilis 2020  . Syphilis 2020    Past Surgical History:  Procedure Laterality Date  . EYE SURGERY     to remove styes at age 85  . MYOMECTOMY  05/21/2017   laparoscopic - labor and vaginal delivery not recommended by [facility]  . wisdom teeth Bilateral     Current Outpatient Medications  Medication Sig Dispense Refill  . acetaminophen (TYLENOL) 500 MG chewable tablet Chew 500 mg by mouth every 6 (six) hours as needed for pain.    . clobetasol ointment (TEMOVATE) 3.22 % Apply 1 application topically as needed.     . famciclovir (FAMVIR) 500 MG tablet Take two tablets (1000 mg) by mouth twice a day for one day as needed. 30 tablet 0  . FLORASTOR 250 MG capsule Take 250 mg by mouth at bedtime.    . fluconazole (DIFLUCAN) 150 MG tablet Take 1 po today and repeat in 72 hours if  needed 2 tablet 0  . NONFORMULARY OR COMPOUNDED ITEM Boric Acid vaginal suppository 600 mg. Place one suppository vaginally at hs for 30 days. (Patient not taking: Reported on 11/24/2019) 30 each 0  . nystatin-triamcinolone (MYCOLOG II) cream Apply 1 application topically 2 (two) times daily. Apply to affected area BID for up to 7 days. 60 g 0  . triamcinolone ointment (KENALOG) 0.1 % Daily to eczema, once resolved, continue only on weekends to prevent recurrences     No current facility-administered medications for this visit.     ALLERGIES: Acyclovir and related, Lactose intolerance (gi), Ampicillin, Pollen extract, and Valtrex [valacyclovir]  Family History  Problem Relation Age of Onset  . Hypertension Mother   . Thyroid disease Mother   . Diabetes Father   . Hypertension Father   . Diabetes Maternal Grandmother   . Hypertension Maternal Grandmother   . Stroke Maternal Grandmother   .  Diabetes Maternal Grandfather   . Hypertension Maternal Grandfather   . Hypertension Paternal Grandmother   . Hypertension Paternal Grandfather     Social History   Socioeconomic History  . Marital status: Single    Spouse name: Not on file  . Number of children: Not on file  . Years of education: Not on file  . Highest education level: Not on file  Occupational History  . Not on file  Tobacco Use  . Smoking status: Never Smoker  . Smokeless tobacco: Never Used  Vaping Use  . Vaping Use: Never used  Substance and Sexual Activity  . Alcohol use: Not Currently    Alcohol/week: 0.0 standard drinks  . Drug use: No  . Sexual activity: Yes    Partners: Male    Birth control/protection: Abstinence    Comment: nuvaring  Other Topics Concern  . Not on file  Social History Narrative  . Not on file   Social Determinants of Health   Financial Resource Strain:   . Difficulty of Paying Living Expenses:   Food Insecurity:   . Worried About Charity fundraiser in the Last Year:   . Youth worker in the Last Year:   Transportation Needs:   . Film/video editor (Medical):   Marland Kitchen Lack of Transportation (Non-Medical):   Physical Activity:   . Days of Exercise per Week:   . Minutes of Exercise per Session:   Stress:   . Feeling of Stress :   Social Connections:   . Frequency of Communication with Friends and Family:   . Frequency of Social Gatherings with Friends and Family:   . Attends Religious Services:   . Active Member of Clubs or Organizations:   . Attends Archivist Meetings:   Marland Kitchen Marital Status:   Intimate Partner Violence:   . Fear of Current or Ex-Partner:   . Emotionally Abused:   Marland Kitchen Physically Abused:   . Sexually Abused:     Review of Systems  PHYSICAL EXAMINATION:    There were no vitals taken for this visit.    General appearance: alert, cooperative and appears stated age Head: Normocephalic, without obvious abnormality, atraumatic Neck: no adenopathy, supple, symmetrical, trachea midline and thyroid normal to inspection and palpation Lungs: clear to auscultation bilaterally Breasts: normal appearance, no masses or tenderness, No nipple retraction or dimpling, No nipple discharge or bleeding, No axillary or supraclavicular adenopathy Heart: regular rate and rhythm Abdomen: soft, non-tender, no masses,  no organomegaly Extremities: extremities normal, atraumatic, no cyanosis or edema Skin: Skin color, texture, turgor normal. No rashes or lesions Lymph nodes: Cervical, supraclavicular, and axillary nodes normal. No abnormal inguinal nodes palpated Neurologic: Grossly normal  Pelvic: External genitalia:  no lesions              Urethra:  normal appearing urethra with no masses, tenderness or lesions              Bartholins and Skenes: normal                 Vagina: normal appearing vagina with normal color and discharge, no lesions              Cervix: no lesions                Bimanual Exam:  Uterus:  normal size, contour, position,  consistency, mobility, non-tender              Adnexa: no mass,  fullness, tenderness              Rectal exam: {yes no:314532}.  Confirms.              Anus:  normal sphincter tone, no lesions  Chaperone was present for exam.  ASSESSMENT     PLAN     An After Visit Summary was printed and given to the patient.  ______ minutes face to face time of which over 50% was spent in counseling.

## 2020-06-01 ENCOUNTER — Ambulatory Visit: Payer: Self-pay | Admitting: Obstetrics and Gynecology

## 2020-06-01 NOTE — Telephone Encounter (Signed)
Encounter reviewed and closed.  

## 2020-07-24 ENCOUNTER — Telehealth: Payer: Self-pay | Admitting: Obstetrics and Gynecology

## 2020-07-24 NOTE — Telephone Encounter (Signed)
Patient is having "some discharge" and would like an appointment. To triage to assist with scheduling.

## 2020-07-24 NOTE — Telephone Encounter (Signed)
AEX next scheduled 09/2020 with BS H/o +BV per pt and records x 1.5 years ago.  Spoke with pt. Pt states having green colored vaginal discharge, odor and abd cramps x 2 weeks, but has been ongoing for a few months. Pt denies any vaginal irritation, itching, all UTI sx. Pt states was seen by PCP on 07/05/20 for same sx and all testing was negative. See Epic. Pt states would like to talk to Dr Quincy Simmonds about long term suppression therapy. Pt states has same partner as in March 2021 at [place]. Pt states has not changed soaps, using Dove, but states unsure what partner is using and this might be causing pH balance off.  Pt advised to have OV for further evaluation. Pt agreeable.  Pt scheduled with Dr Quincy Simmonds as work-in appt on 9/21 at 415 pm. Pt agreeable to date and time of appt. Declines other appts offered due to work schedule.  Encounter closed.

## 2020-07-25 ENCOUNTER — Encounter: Payer: Self-pay | Admitting: Obstetrics and Gynecology

## 2020-07-25 ENCOUNTER — Other Ambulatory Visit: Payer: Self-pay

## 2020-07-25 ENCOUNTER — Ambulatory Visit: Payer: BC Managed Care – PPO | Admitting: Obstetrics and Gynecology

## 2020-07-25 VITALS — BP 108/60 | HR 72 | Resp 14 | Ht 62.5 in | Wt 191.0 lb

## 2020-07-25 DIAGNOSIS — R7309 Other abnormal glucose: Secondary | ICD-10-CM

## 2020-07-25 DIAGNOSIS — N761 Subacute and chronic vaginitis: Secondary | ICD-10-CM | POA: Diagnosis not present

## 2020-07-25 NOTE — Progress Notes (Signed)
GYNECOLOGY  VISIT   HPI: 34 y.o.   Single  African American  female   G0P0 with Patient's last menstrual period was 07/07/2020.   here for BV sx. Patient complains of having vaginal discharge that "smells acidic" and itching.   Her discharge is greyish in color.  She has had evaluation with her PCP, and she does not have BV by recent evaluation. She does have a history of both yeast and BV.   She was using Metrogel twice weekly for 3 weeks.  She did not receive enough to do treatment for 4 - 6 months.   She has never used boric acid.   Wearing panty liners.   Using [employer].   No new partner.   She was tested for STDs on Sept 1 and was negative.  Using condoms, nonlatex.   Last A1C 6.1 on 07/05/20 on chart review.   Stress at work.  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  States she is doing ok.  GYNECOLOGIC HISTORY: Patient's last menstrual period was 07/07/2020. Contraception: condoms Menopausal hormone therapy:  none Last mammogram: n/a Last pap smear:  09-30-18 Neg:Neg HR HPV,07-24-15 Neg        OB History    Gravida  0   Para      Term      Preterm      AB      Living        SAB      TAB      Ectopic      Multiple      Live Births                 Patient Active Problem List   Diagnosis Date Noted  . Menorrhagia 12/31/2018  . Iron deficiency anemia due to chronic blood loss 12/28/2018  . 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 Seborrheic dermatitis 03/04/2014    Past Medical History:  Diagnosis Date  . Dysmenorrhea   . Eczema    arms and neck  . Elevated hemoglobin A1c   . Fibroid   . Heart murmur    dx'd as a teenager and told would outgrow  . 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  Marland Kitchen Hormone disorder    enlarged thyroid  . Iron deficiency   . Psoriasis of scalp   . Seasonal allergies   . STD (sexually transmitted disease)    Tx'd for Chlamydia and Gonorrhea age 53  . Syphilis 2020  . Syphilis 2020     Past Surgical History:  Procedure Laterality Date  . EYE SURGERY     to remove styes at age 59  . MYOMECTOMY  05/21/2017   laparoscopic - labor and vaginal delivery not recommended by [facility]  . wisdom teeth Bilateral     Current Outpatient Medications  Medication Sig Dispense Refill  . acetaminophen (TYLENOL) 500 MG chewable tablet Chew 500 mg by mouth every 6 (six) hours as needed for pain.    . clobetasol ointment (TEMOVATE) 5.57 % Apply 1 application topically as needed.     . famciclovir (FAMVIR) 500 MG tablet Take two tablets (1000 mg) by mouth twice a day for one day as needed. 30 tablet 0  . famotidine (PEPCID) 20 MG tablet Take by mouth.    . triamcinolone ointment (KENALOG) 0.1 % Daily to eczema, once resolved, continue only on weekends to prevent recurrences    . FLORASTOR 250 MG capsule Take 250 mg by  mouth at bedtime. (Patient not taking: Reported on 07/25/2020)    . NONFORMULARY OR COMPOUNDED ITEM Boric Acid vaginal suppository 600 mg. Place one suppository vaginally at hs for 30 days. (Patient not taking: Reported on 11/24/2019) 30 each 0   No current facility-administered medications for this visit.     ALLERGIES: Acyclovir and related, Lactose intolerance (gi), Ampicillin, Pollen extract, and Valtrex [valacyclovir]  Family History  Problem Relation Age of Onset  . Hypertension Mother   . Thyroid disease Mother   . Diabetes Father   . Hypertension Father   . Diabetes Maternal Grandmother   . Hypertension Maternal Grandmother   . Stroke Maternal Grandmother   . Diabetes Maternal Grandfather   . Hypertension Maternal Grandfather   . Hypertension Paternal Grandmother   . Hypertension Paternal Grandfather     Social History   Socioeconomic History  . Marital status: Single    Spouse name: Not on file  . Number of children: Not on file  . Years of education: Not on file  . Highest education level: Not on file  Occupational History  . Not on file  Tobacco  Use  . Smoking status: Never Smoker  . Smokeless tobacco: Never Used  Vaping Use  . Vaping Use: Never used  Substance and Sexual Activity  . Alcohol use: Not Currently    Alcohol/week: 0.0 standard drinks  . Drug use: No  . Sexual activity: Yes    Partners: Male    Birth control/protection: Abstinence    Comment: nuvaring  Other Topics Concern  . Not on file  Social History Narrative  . Not on file   Social Determinants of Health   Financial Resource Strain:   . Difficulty of Paying Living Expenses: Not on file  Food Insecurity:   . Worried About Charity fundraiser in the Last Year: Not on file  . Ran Out of Food in the Last Year: Not on file  Transportation Needs:   . Lack of Transportation (Medical): Not on file  . Lack of Transportation (Non-Medical): Not on file  Physical Activity:   . Days of Exercise per Week: Not on file  . Minutes of Exercise per Session: Not on file  Stress:   . Feeling of Stress : Not on file  Social Connections:   . Frequency of Communication with Friends and Family: Not on file  . Frequency of Social Gatherings with Friends and Family: Not on file  . Attends Religious Services: Not on file  . Active Member of Clubs or Organizations: Not on file  . Attends Archivist Meetings: Not on file  . Marital Status: Not on file  Intimate Partner Violence:   . Fear of Current or Ex-Partner: Not on file  . Emotionally Abused: Not on file  . Physically Abused: Not on file  . Sexually Abused: Not on file    Review of Systems  Constitutional: Negative.   HENT: Negative.   Eyes: Negative.   Respiratory: Negative.   Cardiovascular: Negative.   Gastrointestinal: Negative.   Endocrine: Negative.   Genitourinary: Positive for vaginal discharge.       Vaginal itching  Musculoskeletal: Negative.   Skin: Negative.   Allergic/Immunologic: Negative.   Neurological: Negative.   Hematological: Negative.   Psychiatric/Behavioral: Negative.      PHYSICAL EXAMINATION:    BP 108/60 (BP Location: Right Arm, Patient Position: Sitting, Cuff Size: Normal)   Pulse 72   Resp 14   Ht 5' 2.5" (1.588  m)   Wt 191 lb (86.6 kg)   LMP 07/07/2020   BMI 34.38 kg/m     General appearance: alert, cooperative and appears stated age  Pelvic: External genitalia:  no lesions              Urethra:  normal appearing urethra with no masses, tenderness or lesions              Bartholins and Skenes: normal                 Vagina: normal appearing vagina with normal color and yellow clumpy and gel like discharge, no lesions              Cervix: no lesions                Bimanual Exam:  Uterus:  normal size, contour, position, consistency, mobility, non-tender              Adnexa: no mass, fullness, tenderness             Chaperone was present for exam.  ASSESSMENT  Chronic vaginitis. Stress. Elevated A1C.   PLAN  We discussed reasons for odor.  Nuswab sent. Monistat 3.  Benadryl for itching at night. Protocols for tx of recurrent BV and recurrent yeast reviewed. Low carb and sugar diet recommended to lower A1C and reduce risk of yeast infections.  Fu prn.

## 2020-07-28 LAB — NUSWAB VAGINITIS PLUS (VG+)
Candida albicans, NAA: POSITIVE — AB
Candida glabrata, NAA: NEGATIVE
Chlamydia trachomatis, NAA: NEGATIVE
Neisseria gonorrhoeae, NAA: NEGATIVE
Trich vag by NAA: NEGATIVE

## 2020-07-31 ENCOUNTER — Other Ambulatory Visit: Payer: Self-pay | Admitting: *Deleted

## 2020-07-31 ENCOUNTER — Telehealth: Payer: Self-pay | Admitting: *Deleted

## 2020-07-31 MED ORDER — FLUCONAZOLE 150 MG PO TABS: ORAL_TABLET | ORAL | 0 refills | Status: DC

## 2020-07-31 MED ORDER — FLUCONAZOLE 150 MG PO TABS: ORAL_TABLET | ORAL | 5 refills | Status: DC

## 2020-07-31 NOTE — Telephone Encounter (Signed)
If she would like to do a suppression regimen for yeast vaginitis, she can do the following:  Diflucan 150 mg po q 72 hours x 3 doses and then start Diflucan 150 mg po weekly for 6 months.   The key to controlling the discharge and the odor is control of her prediabetes, as her infection is not from bacterial vaginosis.  (Boric acid vaginal suppositories will not work.)  To control her prediabetes, she will need to reduce her intake of sugar and carbohydrate, increase her exercise, and work on weight loss.  Please let me know if she would like to see a medical practitioner to help her with this.

## 2020-07-31 NOTE — Telephone Encounter (Signed)
Allison Logan, RN  07/31/2020 10:51 AM EDT Back to Top    Spoke with patient, advised per Dr. Quincy Simmonds.  Rx for diflucan to verified pharmacy.  Patient has additional questions for Dr. Quincy Simmonds, see telephone encounter dated 07/31/20 to review with provider.      Patient states she discussed with D.r Quincy Simmonds recurrent "acidic smelling vaginal d/c", requesting additional recommendations for recurrent yeast, such as boric acid vaginal suppositories.   Reviewed 07/25/20 OV notes/recommendations per Dr. Quincy Simmonds, reveiwed with patient.  Advised testing was negative for BV, boric acid suppositories likely not recommended. Advised patient if symptoms do not resolve or new symptoms develop after tx completed, return call to office. Patient requesting additional recommendations for recurrent vag d/c.   Dr. Quincy Simmonds -please review.

## 2020-07-31 NOTE — Telephone Encounter (Signed)
Spoke with patient, advised per Dr. Quincy Simmonds.  Patient request suppression therapy for yeast. New RX to verified pharmacy, RN will call to cancel previous diflucan Rx. Patient read back instructions.  Patient states she does have a PCP, declines assistance or referral.  Questions answered. Patient verbalizes understanding and is agreeable.    Call placed to [facility], spoke with pharmacist, Barrie Dunker. Diflucan Rx cancelled. Verified second rx for diflucan received, read back instructions.   Encounter closed.

## 2020-07-31 NOTE — Telephone Encounter (Signed)
-----   Message from Nunzio Cobbs, MD sent at 07/30/2020  3:49 PM EDT ----- Please contact patient with results of her vaginitis testing. She tested positive for candida albicans and negative for candida glabrata.  She can treat with Diflucan 150 gm po x 1 and repeat in 72 hours prn or treat with Terazol 7.   Controlling her blood sugar will help her to control the yeast infections.   She does not have bacterial vaginosis.

## 2020-09-05 NOTE — Progress Notes (Signed)
34 y.o.  G0P0 Single African American female here for annual exam.    Taking Diflucan 150 mg po for yeast suppression regimen.   Struggling with weight loss.   Has headaches during her menses. Tylenol extra strength treats it well.   Periods are ok.   Does not eat red meat.  No recent iron infusions.  Does not take iron regularly.  Received her Covid vaccine. Has not received her flu vaccine.   PCP:  None   Patient's last menstrual period was 08/22/2020 (exact date).     Period Cycle (Days): 30 Period Duration (Days): 5-6 Period Pattern: Regular Menstrual Flow:  (light to heavy) Menstrual Control: Maxi pad Menstrual Control Change Freq (Hours): changes pad every 3 hours on heaviest day Dysmenorrhea: (!) Mild Dysmenorrhea Symptoms: Cramping, Headache     Sexually active: Yes.    The current method of family planning is condoms most of the time.    Exercising: Yes.    weights Smoker:  no  Health Maintenance: Pap:  09-30-18 Neg:Neg HR HPV, 07-24-15 Neg History of abnormal Pap:  no MMG:  n/a Colonoscopy:  n/a BMD:   n/a  Result  n/a TDaP: 2017 per patient Gardasil:   Yes HIV: 06-18-19 NR Hep C: 06-18-19 Neg Screening Labs:  PCP.    reports that she has never smoked. She has never used smokeless tobacco. She reports previous alcohol use. She reports that she does not use drugs.  Past Medical History:  Diagnosis Date  . Dysmenorrhea   . Eczema    arms and neck  . Elevated hemoglobin A1c   . Fibroid   . Heart murmur    dx'd as a teenager and told would outgrow  . 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  Marland Kitchen Hormone disorder    enlarged thyroid  . Iron deficiency   . Psoriasis of scalp   . Seasonal allergies   . STD (sexually transmitted disease)    Tx'd for Chlamydia and Gonorrhea age 57  . Syphilis 2020  . Syphilis 2020    Past Surgical History:  Procedure Laterality Date  . EYE SURGERY     to remove styes at age 72  . MYOMECTOMY  05/21/2017   laparoscopic -  labor and vaginal delivery not recommended by [facility]  . wisdom teeth Bilateral     Current Outpatient Medications  Medication Sig Dispense Refill  . acetaminophen (TYLENOL) 500 MG chewable tablet Chew 500 mg by mouth every 6 (six) hours as needed for pain.    . clobetasol ointment (TEMOVATE) 8.46 % Apply 1 application topically as needed.     . famciclovir (FAMVIR) 500 MG tablet Take two tablets (1000 mg) by mouth twice a day for one day as needed. 30 tablet 0  . famotidine (PEPCID) 20 MG tablet Take by mouth.    . fluconazole (DIFLUCAN) 150 MG tablet Diflucan 150 mg po q72 hours x3 doses and then one tablet po weekly for 6 months. 6 tablet 5  . triamcinolone ointment (KENALOG) 0.1 % Daily to eczema, once resolved, continue only on weekends to prevent recurrences     No current facility-administered medications for this visit.    Family History  Problem Relation Age of Onset  . Hypertension Mother   . Thyroid disease Mother   . Diabetes Father   . Hypertension Father   . Diabetes Maternal Grandmother   . Hypertension Maternal Grandmother   . Stroke Maternal Grandmother   . Diabetes Maternal Grandfather   .  Hypertension Maternal Grandfather   . Hypertension Paternal Grandmother   . Hypertension Paternal Grandfather     Review of Systems  All other systems reviewed and are negative.   Exam:   BP 140/80 (Cuff Size: Large)   Pulse 80   Resp (!) 25   Ht 5' 2.5" (1.588 m)   Wt 194 lb (88 kg)   LMP 08/22/2020 (Exact Date)   BMI 34.92 kg/m     General appearance: alert, cooperative and appears stated age Head: normocephalic, without obvious abnormality, atraumatic Neck: no adenopathy, supple, symmetrical, trachea midline and thyroid normal to inspection and palpation Lungs: clear to auscultation bilaterally Breasts: normal appearance, no masses or tenderness, No nipple retraction or dimpling, No nipple discharge or bleeding, No axillary adenopathy Heart: regular rate and  rhythm Abdomen: soft, non-tender; no masses, no organomegaly Extremities: extremities normal, atraumatic, no cyanosis or edema Skin: skin color, texture, turgor normal. No rashes or lesions Lymph nodes: cervical, supraclavicular, and axillary nodes normal. Neurologic: grossly normal  Pelvic: External genitalia:  no lesions              No abnormal inguinal nodes palpated.              Urethra:  normal appearing urethra with no masses, tenderness or lesions              Bartholins and Skenes: normal                 Vagina: normal appearing vagina with normal color and discharge, no lesions              Cervix: no lesions              Pap taken: No. Bimanual Exam:  Uterus:  normal size, contour, position, consistency, mobility, non-tender              Adnexa: no mass, fullness, tenderness             Chaperone was present for exam.  Assessment:   Well woman visit with normal exam. Hx anemia.  Status post iron infusion in March, 2020.  Status post laparoscopic myomectomy.  Chronic vaginitis.  On Diflucan for suppression of yeast vaginitis. Hx HSV.  Hx GC/CT. Hx syphilis. Hx anemia.  Plan: Mammogram screening discussed. Self breast awareness reviewed. Pap and HR HPV as above. Guidelines for Calcium, Vitamin D, regular exercise program including cardiovascular and weight bearing exercise. Referral to weight management.  Iron, ferritin, CBC, hep B and C. Follow up annually and prn.

## 2020-09-06 ENCOUNTER — Ambulatory Visit: Payer: BC Managed Care – PPO | Admitting: Obstetrics and Gynecology

## 2020-09-06 ENCOUNTER — Encounter: Payer: Self-pay | Admitting: Obstetrics and Gynecology

## 2020-09-06 ENCOUNTER — Other Ambulatory Visit: Payer: Self-pay

## 2020-09-06 VITALS — BP 140/80 | HR 80 | Resp 25 | Ht 62.5 in | Wt 194.0 lb

## 2020-09-06 DIAGNOSIS — Z113 Encounter for screening for infections with a predominantly sexual mode of transmission: Secondary | ICD-10-CM | POA: Diagnosis not present

## 2020-09-06 DIAGNOSIS — Z01419 Encounter for gynecological examination (general) (routine) without abnormal findings: Secondary | ICD-10-CM | POA: Diagnosis not present

## 2020-09-06 DIAGNOSIS — Z6834 Body mass index (BMI) 34.0-34.9, adult: Secondary | ICD-10-CM | POA: Diagnosis not present

## 2020-09-06 DIAGNOSIS — Z862 Personal history of diseases of the blood and blood-forming organs and certain disorders involving the immune mechanism: Secondary | ICD-10-CM

## 2020-09-06 MED ORDER — FAMCICLOVIR 500 MG PO TABS
ORAL_TABLET | ORAL | 0 refills | Status: DC
Start: 2020-09-06 — End: 2021-06-26

## 2020-09-06 NOTE — Patient Instructions (Signed)

## 2020-09-07 LAB — HEPATITIS B SURFACE ANTIGEN: Hepatitis B Surface Ag: NEGATIVE

## 2020-09-07 LAB — CBC
Hematocrit: 33.9 % — ABNORMAL LOW (ref 34.0–46.6)
Hemoglobin: 11.3 g/dL (ref 11.1–15.9)
MCH: 27.9 pg (ref 26.6–33.0)
MCHC: 33.3 g/dL (ref 31.5–35.7)
MCV: 84 fL (ref 79–97)
Platelets: 318 10*3/uL (ref 150–450)
RBC: 4.05 x10E6/uL (ref 3.77–5.28)
RDW: 13.1 % (ref 11.7–15.4)
WBC: 5.9 10*3/uL (ref 3.4–10.8)

## 2020-09-07 LAB — HEPATITIS C ANTIBODY: Hep C Virus Ab: <0.1 s/co ratio (ref 0.0–0.9)

## 2020-09-07 LAB — FERRITIN: Ferritin: 11 ng/mL — ABNORMAL LOW (ref 15–150)

## 2020-09-07 LAB — IRON: Iron: 22 ug/dL — ABNORMAL LOW (ref 27–159)

## 2020-09-25 ENCOUNTER — Encounter: Payer: Self-pay | Admitting: Family

## 2020-09-25 ENCOUNTER — Inpatient Hospital Stay: Payer: BC Managed Care – PPO | Attending: Family | Admitting: Family

## 2020-09-25 ENCOUNTER — Other Ambulatory Visit: Payer: Self-pay | Admitting: Family

## 2020-09-25 ENCOUNTER — Telehealth: Payer: Self-pay

## 2020-09-25 ENCOUNTER — Other Ambulatory Visit: Payer: Self-pay

## 2020-09-25 VITALS — BP 144/79 | HR 64 | Temp 98.9°F | Resp 17 | Ht 62.5 in | Wt 194.0 lb

## 2020-09-25 DIAGNOSIS — D500 Iron deficiency anemia secondary to blood loss (chronic): Secondary | ICD-10-CM | POA: Insufficient documentation

## 2020-09-25 DIAGNOSIS — N920 Excessive and frequent menstruation with regular cycle: Secondary | ICD-10-CM | POA: Diagnosis not present

## 2020-09-25 NOTE — Progress Notes (Signed)
Hematology and Oncology Follow Up Visit  Allison Bentley [id number] 03-27-1986 34 y.o. 09/25/2020   Principle Diagnosis:  Iron deficiency anemia secondary to menorrhagia  Past Therapy: S/p myomectomy for uterine fibroids in 2019 at [facility]   Current Therapy:   IV iron as indicated    Interim History:  Allison Bentley is here today for follow-up. She had lab work recently with her gynecologist.  Her ferritin at that times was  11%, total iron 22, Hgb 11.3 and MCV 84.  She has heavy cycle, regular cycles. She has not noted clots.  No other blood loss noted. No bruising or petechiae.  She has not noted fatigue but does have HA's with her cycle. Her brother's have history of cluster headaches.  She has occasional numbness and tingling in her hands and feet.  No fever, chills, n/v, cough, rash, dizziness, SOB, chest pain, palpitations, abdominal pain or changes in bowel or bladder habits.  No swelling or tenderness in her extremities.  No falls or syncope.  She has maintained a good appetite and is staying well hydrated. Her weight is stable.  She works out regularly. She notes that balance exercises can be hard for her.   ECOG Performance Status: 1 - Symptomatic but completely ambulatory  Medications:  Allergies as of 09/25/2020      Reactions   Acyclovir And Related    Paresthesia in hands, legs, and feet.  Felt jittery.   Lactose Intolerance (gi)    Ampicillin Itching, Rash   Pollen Extract Itching   Valtrex [valacyclovir] Itching, Rash      Medication List       Accurate as of September 25, 2020  4:23 PM. If you have any questions, ask your nurse or doctor.        acetaminophen 500 MG chewable tablet Commonly known as: TYLENOL Chew 500 mg by mouth every 6 (six) hours as needed for pain.   clobetasol ointment 0.05 % Commonly known as: TEMOVATE Apply 1 application topically as needed.   famciclovir 500 MG tablet Commonly known as: Famvir Take two tablets (1000 mg) by  mouth twice a day for one day as needed.   famotidine 20 MG tablet Commonly known as: PEPCID Take by mouth.   fluconazole 150 MG tablet Commonly known as: DIFLUCAN Diflucan 150 mg po q72 hours x3 doses and then one tablet po weekly for 6 months.   triamcinolone ointment 0.1 % Commonly known as: KENALOG Daily to eczema, once resolved, continue only on weekends to prevent recurrences       Allergies:  Allergies  Allergen Reactions  . Acyclovir And Related     Paresthesia in hands, legs, and feet.  Felt jittery.  . Lactose Intolerance (Gi)   . Ampicillin Itching and Rash  . Pollen Extract Itching  . Valtrex [Valacyclovir] Itching and Rash    Past Medical History, Surgical history, Social history, and Family History were reviewed and updated.  Review of Systems: All other 10 point review of systems is negative.   Physical Exam:  height is 5' 2.5" (1.588 m) and weight is 194 lb (88 kg). Her oral temperature is 98.9 F (37.2 C). Her blood pressure is 144/79 (abnormal) and her pulse is 64. Her respiration is 17 and oxygen saturation is 100%.   Wt Readings from Last 3 Encounters:  09/25/20 194 lb (88 kg)  09/06/20 194 lb (88 kg)  07/25/20 191 lb (86.6 kg)    Ocular: Sclerae unicteric, pupils equal, round and reactive to  light Ear-nose-throat: Oropharynx clear, dentition fair Lymphatic: No cervical or supraclavicular adenopathy Lungs no rales or rhonchi, good excursion bilaterally Heart regular rate and rhythm, no murmur appreciated Abd soft, nontender, positive bowel sounds MSK no focal spinal tenderness, no joint edema Neuro: non-focal, well-oriented, appropriate affect Breasts: Deferred   Lab Results  Component Value Date   WBC 5.9 09/06/2020   HGB 11.3 09/06/2020   HCT 33.9 (L) 09/06/2020   MCV 84 09/06/2020   PLT 318 09/06/2020   Lab Results  Component Value Date   FERRITIN 11 (L) 09/06/2020   IRON 22 (L) 09/06/2020   TIBC 281 03/05/2019   UIBC 217  03/05/2019   IRONPCTSAT 23 03/05/2019   Lab Results  Component Value Date   RBC 4.05 09/06/2020   No results found for: KPAFRELGTCHN, LAMBDASER, KAPLAMBRATIO No results found for: IGGSERUM, IGA, IGMSERUM No results found for: Odetta Pink, SPEI   Chemistry      Component Value Date/Time   NA 141 09/01/2019 1653   K 4.3 09/01/2019 1653   CL 105 09/01/2019 1653   CO2 24 09/01/2019 1653   BUN 10 09/01/2019 1653   CREATININE 0.73 09/01/2019 1653   CREATININE 0.84 12/31/2018 1157   CREATININE 0.79 08/05/2016 1312      Component Value Date/Time   CALCIUM 9.0 09/01/2019 1653   ALKPHOS 59 09/01/2019 1653   AST 17 09/01/2019 1653   AST 19 12/31/2018 1157   ALT 13 09/01/2019 1653   ALT 15 12/31/2018 1157   BILITOT 0.2 09/01/2019 1653   BILITOT 0.4 12/31/2018 1157       Impression and Plan: Allison Bentley is a very pleasant 34 yo African American female with iron deficiency anemia secondary to heavy cycles.  We will get her set up for 5 doses of Venofer  Follow-up in 8 weeks.  She can contact our office with any questions or concerns.   Laverna Peace, NP 11/22/20214:23 PM

## 2020-09-25 NOTE — Telephone Encounter (Signed)
appts made and printed for Allison Bentley, pts work sch will cause Allison Bentley not to rec. Iron tx as  Instructed, inbasket to Judson Roch to advise.... AOM

## 2020-09-26 ENCOUNTER — Telehealth: Payer: Self-pay

## 2020-09-26 NOTE — Telephone Encounter (Signed)
Per 09/25/20 inbasket message "That will be fine :) It's hard to come in that many times in a row. "  In regards to her iron tx appts being spreadout.... AOM

## 2020-09-27 ENCOUNTER — Other Ambulatory Visit: Payer: Self-pay

## 2020-09-27 ENCOUNTER — Inpatient Hospital Stay: Payer: BC Managed Care – PPO

## 2020-09-27 VITALS — BP 141/93 | HR 66 | Resp 17

## 2020-09-27 DIAGNOSIS — D500 Iron deficiency anemia secondary to blood loss (chronic): Secondary | ICD-10-CM

## 2020-09-27 MED ORDER — SODIUM CHLORIDE 0.9 % IV SOLN
200.0000 mg | Freq: Once | INTRAVENOUS | Status: AC
  Administered 2020-09-27: 200 mg via INTRAVENOUS
  Filled 2020-09-27: qty 200

## 2020-09-27 MED ORDER — SODIUM CHLORIDE 0.9 % IV SOLN
Freq: Once | INTRAVENOUS | Status: AC
  Filled 2020-09-27: qty 250

## 2020-09-27 NOTE — Patient Instructions (Signed)

## 2020-10-03 ENCOUNTER — Telehealth: Payer: Self-pay

## 2020-10-03 NOTE — Telephone Encounter (Signed)
Returned pts call as her work sch will not allow iron tx as scheduled, new dates per pt req...Marland KitchenAOM

## 2020-10-04 ENCOUNTER — Inpatient Hospital Stay: Payer: BC Managed Care – PPO

## 2020-10-11 ENCOUNTER — Inpatient Hospital Stay: Payer: BC Managed Care – PPO

## 2020-10-13 ENCOUNTER — Other Ambulatory Visit: Payer: Self-pay | Admitting: Nurse Practitioner

## 2020-10-13 ENCOUNTER — Ambulatory Visit: Payer: BC Managed Care – PPO | Admitting: Nurse Practitioner

## 2020-10-13 ENCOUNTER — Encounter: Payer: Self-pay | Admitting: Nurse Practitioner

## 2020-10-13 ENCOUNTER — Telehealth: Payer: Self-pay

## 2020-10-13 ENCOUNTER — Other Ambulatory Visit: Payer: Self-pay

## 2020-10-13 VITALS — BP 122/80 | HR 68 | Resp 16 | Wt 195.0 lb

## 2020-10-13 DIAGNOSIS — B9689 Other specified bacterial agents as the cause of diseases classified elsewhere: Secondary | ICD-10-CM

## 2020-10-13 DIAGNOSIS — N760 Acute vaginitis: Secondary | ICD-10-CM

## 2020-10-13 DIAGNOSIS — Z113 Encounter for screening for infections with a predominantly sexual mode of transmission: Secondary | ICD-10-CM | POA: Diagnosis not present

## 2020-10-13 MED ORDER — CLEOCIN 100 MG VA SUPP: 100.0000 mg | Freq: Every day | VAGINAL | 0 refills | Status: DC

## 2020-10-13 MED ORDER — BORIC ACID VAGINAL 600 MG VA SUPP: 600.0000 mg | VAGINAL | 1 refills | Status: DC

## 2020-10-13 NOTE — Telephone Encounter (Signed)
Patient is calling in regards to having BV. Offered patient available appointment this morning, patient needs another date due to work. No available appointments.

## 2020-10-13 NOTE — Patient Instructions (Signed)
Bacterial Vaginosis  Bacterial vaginosis is a vaginal infection that occurs when the normal balance of bacteria in the vagina is disrupted. It results from an overgrowth of certain bacteria. This is the most common vaginal infection among women ages 15-44. Because bacterial vaginosis increases your risk for STIs (sexually transmitted infections), getting treated can help reduce your risk for chlamydia, gonorrhea, herpes, and HIV (human immunodeficiency virus). Treatment is also important for preventing complications in pregnant women, because this condition can cause an early (premature) delivery. What are the causes? This condition is caused by an increase in harmful bacteria that are normally present in small amounts in the vagina. However, the reason that the condition develops is not fully understood. What increases the risk? The following factors may make you more likely to develop this condition:  Having a new sexual partner or multiple sexual partners.  Having unprotected sex.  Douching.  Having an intrauterine device (IUD).  Smoking.  Drug and alcohol abuse.  Taking certain antibiotic medicines.  Being pregnant. You cannot get bacterial vaginosis from toilet seats, bedding, swimming pools, or contact with objects around you. What are the signs or symptoms? Symptoms of this condition include:  Grey or white vaginal discharge. The discharge can also be watery or foamy.  A fish-like odor with discharge, especially after sexual intercourse or during menstruation.  Itching in and around the vagina.  Burning or pain with urination. Some women with bacterial vaginosis have no signs or symptoms. How is this diagnosed? This condition is diagnosed based on:  Your medical history.  A physical exam of the vagina.  Testing a sample of vaginal fluid under a microscope to look for a large amount of bad bacteria or abnormal cells. Your health care provider may use a cotton swab or  a small wooden spatula to collect the sample. How is this treated? This condition is treated with antibiotics. These may be given as a pill, a vaginal cream, or a medicine that is put into the vagina (suppository). If the condition comes back after treatment, a second round of antibiotics may be needed. Follow these instructions at home: Medicines  Take over-the-counter and prescription medicines only as told by your health care provider.  Take or use your antibiotic as told by your health care provider. Do not stop taking or using the antibiotic even if you start to feel better. General instructions  If you have a female sexual partner, tell her that you have a vaginal infection. She should see her health care provider and be treated if she has symptoms. If you have a female sexual partner, he does not need treatment.  During treatment: ? Avoid sexual activity until you finish treatment. ? Do not douche. ? Avoid alcohol as directed by your health care provider. ? Avoid breastfeeding as directed by your health care provider.  Drink enough water and fluids to keep your urine clear or pale yellow.  Keep the area around your vagina and rectum clean. ? Wash the area daily with warm water. ? Wipe yourself from front to back after using the toilet.  Keep all follow-up visits as told by your health care provider. This is important. How is this prevented?  Do not douche.  Wash the outside of your vagina with warm water only.  Use protection when having sex. This includes latex condoms and dental dams.  Limit how many sexual partners you have. To help prevent bacterial vaginosis, it is best to have sex with just one partner (  monogamous).  Make sure you and your sexual partner are tested for STIs.  Wear cotton or cotton-lined underwear.  Avoid wearing tight pants and pantyhose, especially during summer.  Limit the amount of alcohol that you drink.  Do not use any products that contain  nicotine or tobacco, such as cigarettes and e-cigarettes. If you need help quitting, ask your health care provider.  Do not use illegal drugs. Where to find more information  Centers for Disease Control and Prevention: www.cdc.gov/std  American Sexual Health Association (ASHA): www.ashastd.org  U.S. Department of Health and Human Services, Office on Women's Health: www.womenshealth.gov/ or https://www.womenshealth.gov/a-z-topics/bacterial-vaginosis Contact a health care provider if:  Your symptoms do not improve, even after treatment.  You have more discharge or pain when urinating.  You have a fever.  You have pain in your abdomen.  You have pain during sex.  You have vaginal bleeding between periods. Summary  Bacterial vaginosis is a vaginal infection that occurs when the normal balance of bacteria in the vagina is disrupted.  Because bacterial vaginosis increases your risk for STIs (sexually transmitted infections), getting treated can help reduce your risk for chlamydia, gonorrhea, herpes, and HIV (human immunodeficiency virus). Treatment is also important for preventing complications in pregnant women, because the condition can cause an early (premature) delivery.  This condition is treated with antibiotic medicines. These may be given as a pill, a vaginal cream, or a medicine that is put into the vagina (suppository). This information is not intended to replace advice given to you by your health care provider. Make sure you discuss any questions you have with your health care provider. Document Revised: 10/03/2017 Document Reviewed: 07/06/2016 Elsevier Patient Education  2020 Elsevier Inc.  

## 2020-10-13 NOTE — Telephone Encounter (Signed)
AEX 09/06/20 with BS H/o chronic vaginitis  +yeast 07/2020, 01/2020 +BV x 3 in 2020.  Spoke with pt. Pt states having vaginal discharge and odor x 1-2 weeks now. Pt states " this is a recurrent problem" Denies vaginal itching, fever, chills, abd pain. Pt advised to have OV for evaluation. Pt agreeable. Pt scheduled for OV today with Claiborne Billings, NP at 330 pm. Pt verbalized understanding to date and time of appt.  Routing to [place], NP for review Encounter closed

## 2020-10-13 NOTE — Progress Notes (Signed)
GYNECOLOGY  VISIT  CC:   Discharge and odor  HPI: 34 y.o. G0P0 Single Black or [place] American female here for vaginal discharge & odor.   Reports she has had a frustrating year, has had a lot of recurrent vaginal infections. The pharmacy is giving her a hard time about diflucan. Between here and her PCP, has had either yeast or BV about 6 times. She feels like she needs a better solution to manage recurrence.   She does not take any hormones, uses condoms for birth control (most of the time). Always concerned about infidelity.  Is interested in suppression for BV, has used metronidazole and Metrogel multiple times.   GYNECOLOGIC HISTORY: Patient's last menstrual period was 10/06/2020 (exact date). Contraception: condoms most of the time Menopausal hormone therapy: none  Patient Active Problem List   Diagnosis Date Noted  . Menorrhagia 12/31/2018  . Iron deficiency anemia due to chronic blood loss 12/28/2018  . 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 Seborrheic dermatitis 03/04/2014    Past Medical History:  Diagnosis Date  . Dysmenorrhea   . Eczema    arms and neck  . Elevated hemoglobin A1c   . Fibroid   . Heart murmur    dx'd as a teenager and told would outgrow  . 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  Marland Kitchen Hormone disorder    enlarged thyroid  . Iron deficiency   . Psoriasis of scalp   . Seasonal allergies   . STD (sexually transmitted disease)    Tx'd for Chlamydia and Gonorrhea age 60  . Syphilis 2020  . Syphilis 2020    Past Surgical History:  Procedure Laterality Date  . EYE SURGERY     to remove styes at age 69  . MYOMECTOMY  05/21/2017   laparoscopic - labor and vaginal delivery not recommended by [facility]  . wisdom teeth Bilateral     MEDS:   Current Outpatient Medications on File Prior to Visit  Medication Sig Dispense Refill  . acetaminophen (TYLENOL) 500 MG chewable tablet Chew 500 mg by mouth every 6 (six) hours as needed for pain.    .  clobetasol ointment (TEMOVATE) 8.58 % Apply 1 application topically as needed.     . famciclovir (FAMVIR) 500 MG tablet Take two tablets (1000 mg) by mouth twice a day for one day as needed. 30 tablet 0  . famotidine (PEPCID) 20 MG tablet Take by mouth.    . triamcinolone ointment (KENALOG) 0.1 % Daily to eczema, once resolved, continue only on weekends to prevent recurrences     No current facility-administered medications on file prior to visit.    ALLERGIES: Acyclovir and related, Lactose intolerance (gi), Ampicillin, Pollen extract, and Valtrex [valacyclovir]  Family History  Problem Relation Age of Onset  . Hypertension Mother   . Thyroid disease Mother   . Diabetes Father   . Hypertension Father   . Diabetes Maternal Grandmother   . Hypertension Maternal Grandmother   . Stroke Maternal Grandmother   . Diabetes Maternal Grandfather   . Hypertension Maternal Grandfather   . Hypertension Paternal Grandmother   . Hypertension Paternal Grandfather     Review of Systems  Constitutional: Negative.   HENT: Negative.   Eyes: Negative.   Respiratory: Negative.   Cardiovascular: Negative.   Gastrointestinal: Negative.   Endocrine: Negative.   Genitourinary:       Vaginal discharge & odor  Musculoskeletal: Negative.   Skin: Negative.   Allergic/Immunologic: Negative.  Neurological: Negative.   Hematological: Negative.   Psychiatric/Behavioral: Negative.     PHYSICAL EXAMINATION:    BP 122/80   Pulse 68   Resp 16   Wt 195 lb (88.5 kg)   LMP 10/06/2020 (Exact Date)   BMI 35.10 kg/m     General appearance: alert, cooperative and appears stated age  Lymph:  no inguinal LAD noted  Pelvic: External genitalia:  no lesions              Urethra:  normal appearing urethra with no masses, tenderness or lesions              Bartholins and Skenes: normal                 Vagina: normal appearing vagina with small amount greyish color discharge, no lesions              Cervix:  no cervical motion tenderness and no lesions              Bimanual Exam:  Uterus:  normal size, contour, position, consistency, mobility, non-tender              Adnexa: no mass, fullness, tenderness   POC wet mount done: + clue, +whiff, pH 4.5, Neg trich, neg yeast Will send off affirm to confirm           Chaperone,Joy, CMA, was present for exam.  Assessment: BV STD Screen   Plan: Will try a different antibiotic for BV, has never tried cleocin yet. Rx sent for Cleocin ovules. Pt to call if Cleocin ovules too expensive and will try Clindamycin po.  After treatment, will try suppressive therapy x 3 months with Boric acid. Discussed use. Rx sent to [facility].   GC/CT sent to lab Affirm sent to lab

## 2020-10-14 ENCOUNTER — Encounter: Payer: Self-pay | Admitting: Obstetrics and Gynecology

## 2020-10-14 LAB — VAGINITIS/VAGINOSIS, DNA PROBE
Candida Species: NEGATIVE
Gardnerella vaginalis: POSITIVE — AB
Trichomonas vaginosis: NEGATIVE

## 2020-10-14 LAB — GC/CHLAMYDIA PROBE AMP
Chlamydia trachomatis, NAA: NEGATIVE
Neisseria Gonorrhoeae by PCR: NEGATIVE

## 2020-10-16 ENCOUNTER — Other Ambulatory Visit: Payer: Self-pay

## 2020-10-16 ENCOUNTER — Other Ambulatory Visit: Payer: Self-pay | Admitting: Nurse Practitioner

## 2020-10-16 ENCOUNTER — Inpatient Hospital Stay: Payer: BC Managed Care – PPO | Attending: Family

## 2020-10-16 VITALS — BP 145/99 | HR 75 | Temp 98.7°F | Resp 18

## 2020-10-16 DIAGNOSIS — N920 Excessive and frequent menstruation with regular cycle: Secondary | ICD-10-CM | POA: Diagnosis not present

## 2020-10-16 DIAGNOSIS — D500 Iron deficiency anemia secondary to blood loss (chronic): Secondary | ICD-10-CM | POA: Diagnosis present

## 2020-10-16 DIAGNOSIS — B9689 Other specified bacterial agents as the cause of diseases classified elsewhere: Secondary | ICD-10-CM

## 2020-10-16 MED ORDER — CLINDAMYCIN PHOSPHATE 2 % VA CREA: 1.0000 | TOPICAL_CREAM | Freq: Every day | VAGINAL | 0 refills | Status: AC

## 2020-10-16 MED ORDER — CLINDAMYCIN HCL 300 MG PO CAPS: 300.0000 mg | ORAL_CAPSULE | Freq: Two times a day (BID) | ORAL | 0 refills | Status: AC

## 2020-10-16 MED ORDER — SODIUM CHLORIDE 0.9 % IV SOLN
200.0000 mg | Freq: Once | INTRAVENOUS | Status: AC
  Administered 2020-10-16: 200 mg via INTRAVENOUS
  Filled 2020-10-16: qty 200

## 2020-10-16 MED ORDER — SODIUM CHLORIDE 0.9 % IV SOLN
Freq: Once | INTRAVENOUS | Status: AC
  Filled 2020-10-16: qty 250

## 2020-10-16 NOTE — Progress Notes (Unsigned)
Received message from pharmacy that unable to fill cleocin ovules, too expensive ($170) and not in stock. They wanted alternative but was unable to advised me price of alternatives until medications were sent in and they could run through computer and her insurance. Sent Rx for Clindamycin vaginal 2% and Clindamycin 300mg . Pharmacy said they would discuss with patient and dispense only one. Attempted to call patient but pt did not answer.

## 2020-10-16 NOTE — Patient Instructions (Signed)

## 2020-10-16 NOTE — Telephone Encounter (Signed)
Pharmacy is asking for an alternative due to medication being out of stock & 170.00

## 2020-10-16 NOTE — Progress Notes (Signed)
Pt discharged in no apparent distress. Pt left ambulatory without assistance. Pt aware of discharge instructions and verbalized understanding and had no further questions.  

## 2020-10-17 ENCOUNTER — Encounter: Payer: Self-pay | Admitting: Nurse Practitioner

## 2020-10-18 ENCOUNTER — Inpatient Hospital Stay: Payer: BC Managed Care – PPO

## 2020-10-23 ENCOUNTER — Inpatient Hospital Stay: Payer: BC Managed Care – PPO

## 2020-10-23 ENCOUNTER — Telehealth: Payer: Self-pay

## 2020-10-23 NOTE — Telephone Encounter (Signed)
Pt came in late for iron appt and needed to r/s, she also req to r/s 12/22, deone, and a new calendar was pritned for the pt.... ,AOM

## 2020-10-25 ENCOUNTER — Inpatient Hospital Stay: Payer: BC Managed Care – PPO

## 2020-10-26 ENCOUNTER — Inpatient Hospital Stay: Payer: BC Managed Care – PPO

## 2020-10-26 ENCOUNTER — Other Ambulatory Visit: Payer: Self-pay

## 2020-10-26 VITALS — BP 130/80 | HR 67 | Temp 98.6°F | Resp 17

## 2020-10-26 DIAGNOSIS — D500 Iron deficiency anemia secondary to blood loss (chronic): Secondary | ICD-10-CM

## 2020-10-26 MED ORDER — SODIUM CHLORIDE 0.9 % IV SOLN
Freq: Once | INTRAVENOUS | Status: AC
  Filled 2020-10-26: qty 250

## 2020-10-26 MED ORDER — SODIUM CHLORIDE 0.9 % IV SOLN
200.0000 mg | Freq: Once | INTRAVENOUS | Status: AC
  Administered 2020-10-26: 200 mg via INTRAVENOUS
  Filled 2020-10-26: qty 200

## 2020-10-30 ENCOUNTER — Inpatient Hospital Stay: Payer: BC Managed Care – PPO

## 2020-10-30 ENCOUNTER — Other Ambulatory Visit: Payer: Self-pay

## 2020-10-30 VITALS — BP 136/85 | HR 68 | Temp 98.5°F | Resp 17

## 2020-10-30 DIAGNOSIS — D500 Iron deficiency anemia secondary to blood loss (chronic): Secondary | ICD-10-CM

## 2020-10-30 MED ORDER — SODIUM CHLORIDE 0.9 % IV SOLN
Freq: Once | INTRAVENOUS | Status: AC
  Filled 2020-10-30: qty 250

## 2020-10-30 MED ORDER — SODIUM CHLORIDE 0.9 % IV SOLN
200.0000 mg | Freq: Once | INTRAVENOUS | Status: AC
  Administered 2020-10-30: 200 mg via INTRAVENOUS
  Filled 2020-10-30: qty 200

## 2020-10-30 NOTE — Progress Notes (Signed)
Pt discharged in no apparent distress. Pt left ambulatory without assistance. Pt aware of discharge instructions and verbalized understanding and had no further questions.  

## 2020-10-30 NOTE — Patient Instructions (Signed)
Pt discharged in no apparent distress. Pt left ambulatory without assistance. Pt aware of discharge instructions and verbalized understanding and had no further questions.Iron Sucrose injection What is this medicine? IRON SUCROSE (AHY ern SOO krohs) is an iron complex. Iron is used to make healthy red blood cells, which carry oxygen and nutrients throughout the body. This medicine is used to treat iron deficiency anemia in people with chronic kidney disease. This medicine may be used for other purposes; ask your health care provider or pharmacist if you have questions. COMMON BRAND NAME(S): Venofer What should I tell my health care provider before I take this medicine? They need to know if you have any of these conditions:  anemia not caused by low iron levels  heart disease  high levels of iron in the blood  kidney disease  liver disease  an unusual or allergic reaction to iron, other medicines, foods, dyes, or preservatives  pregnant or trying to get pregnant  breast-feeding How should I use this medicine? This medicine is for infusion into a vein. It is given by a health care professional in a hospital or clinic setting. Talk to your pediatrician regarding the use of this medicine in children. While this drug may be prescribed for children as young as 2 years for selected conditions, precautions do apply. Overdosage: If you think you have taken too much of this medicine contact a poison control center or emergency room at once. NOTE: This medicine is only for you. Do not share this medicine with others. What if I miss a dose? It is important not to miss your dose. Call your doctor or health care professional if you are unable to keep an appointment. What may interact with this medicine? Do not take this medicine with any of the following medications:  deferoxamine  dimercaprol  other iron products This medicine may also interact with the following  medications:  chloramphenicol  deferasirox This list may not describe all possible interactions. Give your health care provider a list of all the medicines, herbs, non-prescription drugs, or dietary supplements you use. Also tell them if you smoke, drink alcohol, or use illegal drugs. Some items may interact with your medicine. What should I watch for while using this medicine? Visit your doctor or healthcare professional regularly. Tell your doctor or healthcare professional if your symptoms do not start to get better or if they get worse. You may need blood work done while you are taking this medicine. You may need to follow a special diet. Talk to your doctor. Foods that contain iron include: whole grains/cereals, dried fruits, beans, or peas, leafy green vegetables, and organ meats (liver, kidney). What side effects may I notice from receiving this medicine? Side effects that you should report to your doctor or health care professional as soon as possible:  allergic reactions like skin rash, itching or hives, swelling of the face, lips, or tongue  breathing problems  changes in blood pressure  cough  fast, irregular heartbeat  feeling faint or lightheaded, falls  fever or chills  flushing, sweating, or hot feelings  joint or muscle aches/pains  seizures  swelling of the ankles or feet  unusually weak or tired Side effects that usually do not require medical attention (report to your doctor or health care professional if they continue or are bothersome):  diarrhea  feeling achy  headache  irritation at site where injected  nausea, vomiting  stomach upset  tiredness This list may not describe all possible side   effects. Call your doctor for medical advice about side effects. You may report side effects to FDA at 1-800-FDA-1088. Where should I keep my medicine? This drug is given in a hospital or clinic and will not be stored at home. NOTE: This sheet is a  summary. It may not cover all possible information. If you have questions about this medicine, talk to your doctor, pharmacist, or health care provider.  2020 Elsevier/Gold Standard (2011-08-01 17:14:35)  

## 2020-11-01 ENCOUNTER — Other Ambulatory Visit: Payer: Self-pay

## 2020-11-01 ENCOUNTER — Inpatient Hospital Stay: Payer: BC Managed Care – PPO

## 2020-11-01 VITALS — BP 115/76 | HR 66 | Temp 98.5°F | Resp 17

## 2020-11-01 DIAGNOSIS — D500 Iron deficiency anemia secondary to blood loss (chronic): Secondary | ICD-10-CM | POA: Diagnosis not present

## 2020-11-01 MED ORDER — SODIUM CHLORIDE 0.9 % IV SOLN
Freq: Once | INTRAVENOUS | Status: AC
  Filled 2020-11-01: qty 250

## 2020-11-01 MED ORDER — SODIUM CHLORIDE 0.9 % IV SOLN
200.0000 mg | Freq: Once | INTRAVENOUS | Status: AC
  Administered 2020-11-01: 200 mg via INTRAVENOUS
  Filled 2020-11-01: qty 200

## 2020-11-01 NOTE — Patient Instructions (Signed)

## 2020-11-01 NOTE — Progress Notes (Signed)
Pt discharged in no apparent distress. Pt left ambulatory without assistance. Pt aware of discharge instructions and verbalized understanding and had no further questions. Stable and asymptomatic upon discharge.  

## 2020-11-16 ENCOUNTER — Telehealth: Payer: Self-pay

## 2020-11-16 NOTE — Telephone Encounter (Signed)
Called and left a vm regarding possible delay and or closure for 11/20/20 weather   aom

## 2020-11-17 ENCOUNTER — Telehealth: Payer: Self-pay

## 2020-11-17 NOTE — Telephone Encounter (Signed)
Called pt to r/s her 11/20/20 appt as office will not open until 11 due to weather.  She req to sch lab and visit and then r/s t.  She also states that she spoke with tara today about ins and she has to dble ck that for coverage.    ain

## 2020-11-20 ENCOUNTER — Ambulatory Visit: Payer: BC Managed Care – PPO

## 2020-11-20 ENCOUNTER — Inpatient Hospital Stay: Payer: BC Managed Care – PPO

## 2020-11-20 ENCOUNTER — Inpatient Hospital Stay: Payer: BC Managed Care – PPO | Admitting: Family

## 2020-11-27 ENCOUNTER — Inpatient Hospital Stay (HOSPITAL_BASED_OUTPATIENT_CLINIC_OR_DEPARTMENT_OTHER): Payer: BC Managed Care – PPO | Admitting: Family

## 2020-11-27 ENCOUNTER — Encounter: Payer: Self-pay | Admitting: Family

## 2020-11-27 ENCOUNTER — Other Ambulatory Visit: Payer: Self-pay

## 2020-11-27 ENCOUNTER — Inpatient Hospital Stay: Payer: BC Managed Care – PPO | Attending: Family

## 2020-11-27 VITALS — BP 123/73 | HR 66 | Temp 98.8°F | Resp 18 | Ht 62.0 in | Wt 185.1 lb

## 2020-11-27 DIAGNOSIS — D500 Iron deficiency anemia secondary to blood loss (chronic): Secondary | ICD-10-CM

## 2020-11-27 DIAGNOSIS — Z86018 Personal history of other benign neoplasm: Secondary | ICD-10-CM | POA: Diagnosis not present

## 2020-11-27 DIAGNOSIS — N920 Excessive and frequent menstruation with regular cycle: Secondary | ICD-10-CM | POA: Insufficient documentation

## 2020-11-27 LAB — CBC WITH DIFFERENTIAL (CANCER CENTER ONLY)
Abs Immature Granulocytes: 0.01 10*3/uL (ref 0.00–0.07)
Basophils Absolute: 0 10*3/uL (ref 0.0–0.1)
Basophils Relative: 0 %
Eosinophils Absolute: 0.1 10*3/uL (ref 0.0–0.5)
Eosinophils Relative: 2 %
HCT: 38.8 % (ref 36.0–46.0)
Hemoglobin: 12.6 g/dL (ref 12.0–15.0)
Immature Granulocytes: 0 %
Lymphocytes Relative: 38 %
Lymphs Abs: 2.2 10*3/uL (ref 0.7–4.0)
MCH: 28.2 pg (ref 26.0–34.0)
MCHC: 32.5 g/dL (ref 30.0–36.0)
MCV: 86.8 fL (ref 80.0–100.0)
Monocytes Absolute: 0.5 10*3/uL (ref 0.1–1.0)
Monocytes Relative: 9 %
Neutro Abs: 2.8 10*3/uL (ref 1.7–7.7)
Neutrophils Relative %: 51 %
Platelet Count: 283 10*3/uL (ref 150–400)
RBC: 4.47 MIL/uL (ref 3.87–5.11)
RDW: 14.9 % (ref 11.5–15.5)
WBC Count: 5.6 10*3/uL (ref 4.0–10.5)
nRBC: 0 % (ref 0.0–0.2)

## 2020-11-27 LAB — RETICULOCYTES
Immature Retic Fract: 2.8 % (ref 2.3–15.9)
RBC.: 4.49 MIL/uL (ref 3.87–5.11)
Retic Count, Absolute: 63.3 10*3/uL (ref 19.0–186.0)
Retic Ct Pct: 1.4 % (ref 0.4–3.1)

## 2020-11-27 NOTE — Progress Notes (Signed)
Hematology and Oncology Follow Up Visit  Allison Bentley [id number] 1986-07-13 35 y.o. 11/27/2020   Principle Diagnosis:  Iron deficiency anemia secondary to menorrhagia  Past Therapy: S/p myomectomy for uterine fibroids in 2019 at [facility]   Current Therapy:        IV iron as indicated    Interim History:  Allison Bentley is here today for follow-up. She is doing well and notes that her energy has improved since receiving IV iron.  Hgb is up to 12.6, MCV 86, WBC count 5.6 and platelets 283.  Her cycle remains regular and flow heavy for only a day or so. No other blood loss noted. No abnormal bruising, no petechiae.  No fever, chills, n/v, cough, rash, dizziness, SOB, chest pain, palpitations, abdominal pain or changes in bowel or bladder habits.  No swelling or tenderness in her extremities. She has occasionally random numbness and tingling in her hands and feet. This is unchanged.  No falls or syncope.  She has maintained a good appetite and is staying well hydrated. Her weight is stable at 185 lbs.  She has been working out at [employer] regularly.   ECOG Performance Status: 0 - Asymptomatic  Medications:  Allergies as of 11/27/2020      Reactions   Acyclovir And Related    Paresthesia in hands, legs, and feet.  Felt jittery.   Lactose Intolerance (gi)    Ampicillin Itching, Rash   Pollen Extract Itching   Valtrex [valacyclovir] Itching, Rash      Medication List       Accurate as of November 27, 2020  3:32 PM. If you have any questions, ask your nurse or doctor.        acetaminophen 500 MG chewable tablet Commonly known as: TYLENOL Chew 500 mg by mouth every 6 (six) hours as needed for pain.   Boric Acid Vaginal 600 MG Supp Place 600 mg vaginally 2 (two) times a week.   clobetasol ointment 0.05 % Commonly known as: TEMOVATE Apply 1 application topically as needed.   famciclovir 500 MG tablet Commonly known as: Famvir Take two tablets (1000 mg) by mouth twice a day for  one day as needed.   famotidine 20 MG tablet Commonly known as: PEPCID Take by mouth.   hydrochlorothiazide 12.5 MG tablet Commonly known as: HYDRODIURIL Take 12.5 mg by mouth daily.   triamcinolone ointment 0.1 % Commonly known as: KENALOG Daily to eczema, once resolved, continue only on weekends to prevent recurrences       Allergies:  Allergies  Allergen Reactions  . Acyclovir And Related     Paresthesia in hands, legs, and feet.  Felt jittery.  . Lactose Intolerance (Gi)   . Ampicillin Itching and Rash  . Pollen Extract Itching  . Valtrex [Valacyclovir] Itching and Rash    Past Medical History, Surgical history, Social history, and Family History were reviewed and updated.  Review of Systems: All other 10 point review of systems is negative.   Physical Exam:  vitals were not taken for this visit.   Wt Readings from Last 3 Encounters:  10/13/20 195 lb (88.5 kg)  09/25/20 194 lb (88 kg)  09/06/20 194 lb (88 kg)    Ocular: Sclerae unicteric, pupils equal, round and reactive to light Ear-nose-throat: Oropharynx clear, dentition fair Lymphatic: No cervical or supraclavicular adenopathy Lungs no rales or rhonchi, good excursion bilaterally Heart regular rate and rhythm, no murmur appreciated Abd soft, nontender, positive bowel sounds MSK no focal spinal tenderness,  no joint edema Neuro: non-focal, well-oriented, appropriate affect Breasts: Deferred   Lab Results  Component Value Date   WBC 5.9 09/06/2020   HGB 11.3 09/06/2020   HCT 33.9 (L) 09/06/2020   MCV 84 09/06/2020   PLT 318 09/06/2020   Lab Results  Component Value Date   FERRITIN 11 (L) 09/06/2020   IRON 22 (L) 09/06/2020   TIBC 281 03/05/2019   UIBC 217 03/05/2019   IRONPCTSAT 23 03/05/2019   Lab Results  Component Value Date   RBC 4.05 09/06/2020   No results found for: KPAFRELGTCHN, LAMBDASER, KAPLAMBRATIO No results found for: IGGSERUM, IGA, IGMSERUM No results found for:  Odetta Pink, SPEI   Chemistry      Component Value Date/Time   NA 141 09/01/2019 1653   K 4.3 09/01/2019 1653   CL 105 09/01/2019 1653   CO2 24 09/01/2019 1653   BUN 10 09/01/2019 1653   CREATININE 0.73 09/01/2019 1653   CREATININE 0.84 12/31/2018 1157   CREATININE 0.79 08/05/2016 1312      Component Value Date/Time   CALCIUM 9.0 09/01/2019 1653   ALKPHOS 59 09/01/2019 1653   AST 17 09/01/2019 1653   AST 19 12/31/2018 1157   ALT 13 09/01/2019 1653   ALT 15 12/31/2018 1157   BILITOT 0.2 09/01/2019 1653   BILITOT 0.4 12/31/2018 1157       Impression and Plan: Allison Bentley is a very pleasant 35 yo African American female with iron deficiency anemia secondary to heavy cycles.  Iron studies are pending. We will replace if needed.  Follow-up in 3 months.  She can contact our office with any questions or concerns.   Allison Peace, NP 1/24/20223:32 PM

## 2020-11-28 DIAGNOSIS — R7303 Prediabetes: Secondary | ICD-10-CM | POA: Insufficient documentation

## 2020-11-28 LAB — IRON AND TIBC
Iron: 102 ug/dL (ref 41–142)
Saturation Ratios: 36 % (ref 21–57)
TIBC: 280 ug/dL (ref 236–444)
UIBC: 178 ug/dL (ref 120–384)

## 2020-11-28 LAB — FERRITIN: Ferritin: 172 ng/mL (ref 11–307)

## 2021-02-26 ENCOUNTER — Inpatient Hospital Stay (HOSPITAL_BASED_OUTPATIENT_CLINIC_OR_DEPARTMENT_OTHER): Payer: BC Managed Care – PPO | Admitting: Hematology & Oncology

## 2021-02-26 ENCOUNTER — Inpatient Hospital Stay: Payer: BC Managed Care – PPO | Attending: Hematology & Oncology

## 2021-02-26 ENCOUNTER — Other Ambulatory Visit: Payer: Self-pay

## 2021-02-26 ENCOUNTER — Encounter: Payer: Self-pay | Admitting: Hematology & Oncology

## 2021-02-26 VITALS — BP 118/72 | HR 63 | Temp 98.5°F | Resp 18 | Wt 189.0 lb

## 2021-02-26 DIAGNOSIS — N920 Excessive and frequent menstruation with regular cycle: Secondary | ICD-10-CM | POA: Insufficient documentation

## 2021-02-26 DIAGNOSIS — D500 Iron deficiency anemia secondary to blood loss (chronic): Secondary | ICD-10-CM

## 2021-02-26 LAB — CBC WITH DIFFERENTIAL (CANCER CENTER ONLY)
Abs Immature Granulocytes: 0.01 10*3/uL (ref 0.00–0.07)
Basophils Absolute: 0 10*3/uL (ref 0.0–0.1)
Basophils Relative: 1 %
Eosinophils Absolute: 0.4 10*3/uL (ref 0.0–0.5)
Eosinophils Relative: 7 %
HCT: 36 % (ref 36.0–46.0)
Hemoglobin: 12.2 g/dL (ref 12.0–15.0)
Immature Granulocytes: 0 %
Lymphocytes Relative: 34 %
Lymphs Abs: 1.8 10*3/uL (ref 0.7–4.0)
MCH: 30.5 pg (ref 26.0–34.0)
MCHC: 33.9 g/dL (ref 30.0–36.0)
MCV: 90 fL (ref 80.0–100.0)
Monocytes Absolute: 0.5 10*3/uL (ref 0.1–1.0)
Monocytes Relative: 9 %
Neutro Abs: 2.6 10*3/uL (ref 1.7–7.7)
Neutrophils Relative %: 49 %
Platelet Count: 261 10*3/uL (ref 150–400)
RBC: 4 MIL/uL (ref 3.87–5.11)
RDW: 12.6 % (ref 11.5–15.5)
WBC Count: 5.3 10*3/uL (ref 4.0–10.5)
nRBC: 0 % (ref 0.0–0.2)

## 2021-02-26 LAB — RETICULOCYTES
Immature Retic Fract: 6.1 % (ref 2.3–15.9)
RBC.: 4.03 MIL/uL (ref 3.87–5.11)
Retic Count, Absolute: 81 10*3/uL (ref 19.0–186.0)
Retic Ct Pct: 2 % (ref 0.4–3.1)

## 2021-02-26 NOTE — Progress Notes (Signed)
Hematology and Oncology Follow Up Visit  Allison Bentley [id number] [birth year] 35 y.o. 02/26/2021   Principle Diagnosis:  Iron deficiency anemia secondary to menorrhagia  Past Therapy: S/p myomectomy for uterine fibroids in 2019 at [facility]   Current Therapy:        IV iron as indicated - Venofer given on 11/01/2020   Interim History:  Allison Bentley is here today for follow-up.  She was a patient of Dr. Lorette Ang.she has history of iron deficiency anemia.  She has been followed by Judson Roch.  She last received iron back in December.  She is feeling well.  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  Back in January, her ferritin was 172 with iron saturation of 36%.  There is been no problems with bleeding.  She has had no issues with heavy cycles.  She has had no issue with cough or shortness of breath.  There is been no problems with COVID.  She has had good appetite.  She is not a vegetarian.  She has had no leg swelling.  There has been no rashes.  She is planning on going to [place] in June to see some friends.  I am sure she will have a fantastic time.  Overall, her performance status is ECOG 1.  Medications:  Allergies as of 02/26/2021      Reactions   Acyclovir And Related Other (See Comments)   Paresthesia in hands, legs, and feet.  Felt jittery.   Lactose Intolerance (gi)    Ampicillin Itching, Rash   Pollen Extract Itching   Valtrex [valacyclovir] Itching, Rash      Medication List       Accurate as of Raushanah 25, 2022  3:52 PM. If you have any questions, ask your nurse or doctor.        acetaminophen 500 MG chewable tablet Commonly known as: TYLENOL Chew 500 mg by mouth every 6 (six) hours as needed for pain.   Boric Acid Vaginal 600 MG Supp Place 600 mg vaginally 2 (two) times a week.   clobetasol ointment 0.05 % Commonly known as: TEMOVATE Apply 1 application topically as needed.   famciclovir 500 MG  tablet Commonly known as: Famvir Take two tablets (1000 mg) by mouth twice a day for one day as needed.   famotidine 20 MG tablet Commonly known as: PEPCID Take by mouth.   hydrochlorothiazide 25 MG tablet Commonly known as: HYDRODIURIL Take 25 mg by mouth daily.   triamcinolone ointment 0.1 % Commonly known as: KENALOG Daily to eczema, once resolved, continue only on weekends to prevent recurrences       Allergies:  Allergies  Allergen Reactions  . Acyclovir And Related Other (See Comments)    Paresthesia in hands, legs, and feet.  Felt jittery.  . Lactose Intolerance (Gi)   . Ampicillin Itching and Rash  . Pollen Extract Itching  . Valtrex [Valacyclovir] Itching and Rash    Past Medical History, Surgical history, Social history, and Family History were reviewed and updated.  Review of Systems: Review of Systems  Constitutional: Negative.   HENT: Negative.   Eyes: Negative.   Respiratory: Negative.   Cardiovascular: Negative.   Gastrointestinal: Negative.   Genitourinary: Negative.   Musculoskeletal: Negative.   Skin: Negative.   Neurological: Negative.   Endo/Heme/Allergies: Negative.   Psychiatric/Behavioral: Negative.      Physical Exam:  weight is 189 lb (85.7 kg). Her oral temperature is 98.5 F (  36.9 C). Her blood pressure is 118/72 and her pulse is 63. Her respiration is 18 and oxygen saturation is 100%.   Wt Readings from Last 3 Encounters:  02/26/21 189 lb (85.7 kg)  11/27/20 185 lb 1.9 oz (84 kg)  10/13/20 195 lb (88.5 kg)    Physical Exam Vitals reviewed.  HENT:     Head: Normocephalic and atraumatic.  Eyes:     Pupils: Pupils are equal, round, and reactive to light.  Cardiovascular:     Rate and Rhythm: Normal rate and regular rhythm.     Heart sounds: Normal heart sounds.  Pulmonary:     Effort: Pulmonary effort is normal.     Breath sounds: Normal breath sounds.  Abdominal:     General: Bowel sounds are normal.     Palpations:  Abdomen is soft.  Musculoskeletal:        General: No tenderness or deformity. Normal range of motion.     Cervical back: Normal range of motion.  Lymphadenopathy:     Cervical: No cervical adenopathy.  Skin:    General: Skin is warm and dry.     Findings: No erythema or rash.  Neurological:     Mental Status: She is alert and oriented to person, place, and time.  Psychiatric:        Behavior: Behavior normal.        Thought Content: Thought content normal.        Judgment: Judgment normal.      Lab Results  Component Value Date   WBC 5.3 02/26/2021   HGB 12.2 02/26/2021   HCT 36.0 02/26/2021   MCV 90.0 02/26/2021   PLT 261 02/26/2021   Lab Results  Component Value Date   FERRITIN 172 11/27/2020   IRON 102 11/27/2020   TIBC 280 11/27/2020   UIBC 178 11/27/2020   IRONPCTSAT 36 11/27/2020   Lab Results  Component Value Date   RETICCTPCT 2.0 02/26/2021   RBC 4.03 02/26/2021   No results found for: KPAFRELGTCHN, LAMBDASER, KAPLAMBRATIO No results found for: IGGSERUM, IGA, IGMSERUM No results found for: Odetta Pink, SPEI   Chemistry      Component Value Date/Time   NA 141 09/01/2019 1653   K 4.3 09/01/2019 1653   CL 105 09/01/2019 1653   CO2 24 09/01/2019 1653   BUN 10 09/01/2019 1653   CREATININE 0.73 09/01/2019 1653   CREATININE 0.84 12/31/2018 1157   CREATININE 0.79 08/05/2016 1312      Component Value Date/Time   CALCIUM 9.0 09/01/2019 1653   ALKPHOS 59 09/01/2019 1653   AST 17 09/01/2019 1653   AST 19 12/31/2018 1157   ALT 13 09/01/2019 1653   ALT 15 12/31/2018 1157   BILITOT 0.2 09/01/2019 1653   BILITOT 0.4 12/31/2018 1157       Impression and Plan: Allison Bentley is a very pleasant 35 yo African American female with iron deficiency anemia secondary to heavy cycles.  She has had a myomectomy.  This was back in 2018.  We will have to see what her iron studies look like.  I would have to believe  that her iron studies should be okay given the fact that her MCV is 90.  I think we can probably move her appointments out to 6 months now.  I am just happy that she is doing well.  I am happy that she feels good.  She is a history Pharmacist, hospital.  We talked  a lot about history.  She is incredibly fun to talk to.    Volanda Napoleon, MD 4/25/20223:52 PM

## 2021-02-27 ENCOUNTER — Telehealth: Payer: Self-pay

## 2021-02-27 LAB — IRON AND TIBC
Iron: 68 ug/dL (ref 41–142)
Saturation Ratios: 25 % (ref 21–57)
TIBC: 273 ug/dL (ref 236–444)
UIBC: 205 ug/dL (ref 120–384)

## 2021-02-27 LAB — FERRITIN: Ferritin: 73 ng/mL (ref 11–307)

## 2021-02-27 NOTE — Telephone Encounter (Signed)
Called andl left a vm with f/u appt per 02/26/21 los   Latanja Lehenbauer

## 2021-04-18 ENCOUNTER — Telehealth: Payer: Self-pay

## 2021-06-26 ENCOUNTER — Other Ambulatory Visit: Payer: Self-pay

## 2021-06-26 MED ORDER — FAMCICLOVIR 500 MG PO TABS: ORAL_TABLET | ORAL | 0 refills | Status: DC

## 2021-06-26 NOTE — Telephone Encounter (Signed)
Last AEX 09/06/20 AEX scheduled 09/10/21  Patient called requesting refill on Famvir.

## 2021-08-20 ENCOUNTER — Inpatient Hospital Stay: Payer: BC Managed Care – PPO | Admitting: Hematology & Oncology

## 2021-08-20 ENCOUNTER — Inpatient Hospital Stay: Payer: BC Managed Care – PPO | Attending: Hematology & Oncology

## 2021-08-27 ENCOUNTER — Encounter: Payer: Self-pay | Admitting: Family

## 2021-08-27 ENCOUNTER — Other Ambulatory Visit: Payer: BC Managed Care – PPO

## 2021-08-27 ENCOUNTER — Ambulatory Visit: Payer: BC Managed Care – PPO | Admitting: Hematology & Oncology

## 2021-08-28 ENCOUNTER — Encounter: Payer: Self-pay | Admitting: Family

## 2021-08-29 ENCOUNTER — Other Ambulatory Visit: Payer: Self-pay

## 2021-08-29 ENCOUNTER — Encounter: Payer: Self-pay | Admitting: Obstetrics and Gynecology

## 2021-08-29 ENCOUNTER — Ambulatory Visit: Payer: BC Managed Care – PPO | Admitting: Obstetrics and Gynecology

## 2021-08-29 VITALS — BP 124/70 | HR 76 | Ht 62.0 in | Wt 185.0 lb

## 2021-08-29 DIAGNOSIS — N939 Abnormal uterine and vaginal bleeding, unspecified: Secondary | ICD-10-CM | POA: Diagnosis not present

## 2021-08-29 DIAGNOSIS — Z789 Other specified health status: Secondary | ICD-10-CM | POA: Diagnosis not present

## 2021-08-29 DIAGNOSIS — Z113 Encounter for screening for infections with a predominantly sexual mode of transmission: Secondary | ICD-10-CM | POA: Diagnosis not present

## 2021-08-29 LAB — PREGNANCY, URINE: Preg Test, Ur: NEGATIVE

## 2021-08-29 MED ORDER — MEDROXYPROGESTERONE ACETATE 10 MG PO TABS: 10.0000 mg | ORAL_TABLET | Freq: Every day | ORAL | 0 refills | Status: DC

## 2021-08-29 NOTE — Progress Notes (Signed)
GYNECOLOGY  VISIT   HPI: 35 y.o.   Single Black or African American Not Hispanic or Latino  female   G0P0 with Patient's last menstrual period was 08/12/2021.   here for irregular bleeding she had a period starting on 08/12/21 and then she started bleeding again 08/27/21.   Her cycles are typically every 21-25 days x 5-6 days. She can saturate a pad in 2-3 hours. Very mild cramps (since myomectomy). She just started her cycle early on 10/24, normal flow, still bleeding. No cramps.  Sexually active, new partner for a couple of months. They always use condoms.   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  No constipation, dry skin, hair loss or fatigue. No galactorrhea.  Some intentional weight loss.   Normal TSH in 5/22. Last STD testing in 1/22.   GYNECOLOGIC HISTORY: Patient's last menstrual period was 08/12/2021. 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   Hormone disorder    enlarged thyroid   Iron deficiency    Psoriasis of scalp    Seasonal allergies    STD (sexually transmitted disease)    Tx'd for Chlamydia and Gonorrhea age 31   Syphilis 2020   Syphilis 2020    Past Surgical History:  Procedure Laterality Date   EYE SURGERY     to remove styes at age 73   MYOMECTOMY   05/21/2017   laparoscopic - labor and vaginal delivery not recommended by [facility]   wisdom teeth Bilateral     Current Outpatient Medications  Medication Sig Dispense Refill   acetaminophen (TYLENOL) 500 MG chewable tablet Chew 500 mg by mouth every 6 (six) hours as needed for pain.     Boric Acid Vaginal 600 MG SUPP Place 600 mg vaginally 2 (two) times a week. 30 suppository 1   clobetasol ointment (TEMOVATE) 9.83 % Apply 1 application topically as needed.      famciclovir (FAMVIR) 500 MG tablet Take two tablets (1000 mg) by mouth twice a day for one day as needed. 30 tablet 0   hydrochlorothiazide (HYDRODIURIL) 25 MG tablet Take 25 mg by mouth daily.     triamcinolone ointment (KENALOG) 0.1 % Daily to eczema, once resolved, continue only on weekends to prevent recurrences     No current facility-administered medications for this visit.     ALLERGIES: Acyclovir and related, Lactose  intolerance (gi), Ampicillin, Pollen extract, and Valtrex [valacyclovir]  Family History  Problem Relation Age of Onset   Hypertension Mother    Thyroid disease Mother    Diabetes Father    Hypertension Father    Diabetes Maternal Grandmother    Hypertension Maternal Grandmother    Stroke Maternal Grandmother    Diabetes Maternal Grandfather    Hypertension Maternal Grandfather    Hypertension Paternal Grandmother    Hypertension Paternal Grandfather     Social History   Socioeconomic History   Marital status: Single    Spouse name: Not on file   Number of children: Not on file   Years of education: Not on file   Highest education level: Not on file  Occupational History   Not on file  Tobacco Use   Smoking status: Never   Smokeless tobacco: Never  Vaping Use   Vaping Use: Never used  Substance and Sexual Activity   Alcohol use: Not Currently    Alcohol/week: 0.0 standard drinks   Drug use: No   Sexual activity: Yes    Partners: Male    Birth control/protection: Condom    Comment:  condoms most of the time  Other Topics Concern   Not on file  Social History Narrative   Not on file   Social Determinants of Health   Financial Resource Strain: Not on file  Food Insecurity: Not on file  Transportation Needs: Not on file  Physical Activity: Not on file  Stress: Not on file  Social Connections: Not on file  Intimate Partner Violence: Not on file    Review of Systems  Cardiovascular:  Negative for orthopnea.  All other systems reviewed and are negative.  PHYSICAL EXAMINATION:    BP 124/70   Pulse 76   Ht 5\' 2"  (1.575 m)   Wt 185 lb (83.9 kg)   LMP 08/12/2021   SpO2 100%   BMI 33.84 kg/m     General appearance: alert, cooperative and appears stated age Neck: no adenopathy, supple, symmetrical, trachea midline and thyroid normal to inspection and palpation Abdomen: soft, non-tender; non distended, no masses,  no organomegaly  Pelvic: External genitalia:  no lesions              Urethra:  normal appearing urethra with no masses, tenderness or lesions              Bartholins and Skenes: normal                 Vagina: normal appearing vagina with normal color and discharge, no lesions              Cervix: no cervical motion tenderness and no lesions              Bimanual Exam:  Uterus:   no masses or tenderness              Adnexa: no mass, fullness, tenderness               Chaperone was present for exam.  1. Abnormal uterine bleeding One episode of abnormal bleeding. She is under stress. Normal TSH in 5/22 - Pregnancy, urine - medroxyPROGESTERone (PROVERA) 10 MG tablet; Take 1 tablet (10 mg total) by mouth daily.  Dispense: 10 tablet; Refill: 0. She will take the provera if her bleeding gets heavier or if it doesn't stop w/in one week. -She will calendar her bleeding. If she has recurrent episodes of abnormal bleeding would recommend further  evaluation -She will f/u with Dr Quincy Simmonds for her annual exam in 2 weeks.   2. Screening examination for STD  (sexually transmitted disease) - RPR - HIV Antibody (routine testing w rflx) - Hepatitis C antibody - SURESWAB CT/NG/T. Vaginalis   3. Uses condoms for contraception -Wants to continue with condoms for now -Will consider other options  CC: Dr Quincy Simmonds

## 2021-08-30 LAB — SURESWAB CT/NG/T. VAGINALIS
C. trachomatis RNA, TMA: NOT DETECTED
N. gonorrhoeae RNA, TMA: NOT DETECTED
Trichomonas vaginalis RNA: NOT DETECTED

## 2021-08-30 LAB — RPR: RPR Ser Ql: NONREACTIVE

## 2021-08-30 LAB — HEPATITIS C ANTIBODY
Hepatitis C Ab: NONREACTIVE
SIGNAL TO CUT-OFF: 0.16 (ref <1.00)

## 2021-08-30 LAB — HIV ANTIBODY (ROUTINE TESTING W REFLEX): HIV 1&2 Ab, 4th Generation: NONREACTIVE

## 2021-09-06 NOTE — Progress Notes (Signed)
35 y.o. G0P0 Single African American female here for annual exam.    Patient complaining of internal vaginal itching and would like evaluation.  No odor.  No burning.   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   Just had STD screening and vaginitis testing on 08/29/21.  She was also seen for abnormal uterine bleeding.  She did not take the Provera prescribed as the bleeding resolved.  Would like pregnancy prevention.  She used a NuvaRing in the past.   Having congestion symptoms today. Kids at school are dealing with all kinds of illness.   PCP:  [place]   Patient's last menstrual period was 08/27/2021 (exact date).     Period Cycle (Days): 30 Period Duration (Days): 5 Period Pattern: Regular (heavy first 2 days then tapes) Menstrual Flow:  (heavy first 2 days then tapers) Menstrual Control: Maxi pad Menstrual Control Change Freq (Hours): changes maxi pad every 3 hours on heaviest day Dysmenorrhea: (!) Mild Dysmenorrhea Symptoms: Cramping, Headache     Sexually active: Yes.    The current method of family planning is condoms most of the time.    Exercising: Yes.     [employer] Smoker:  no  Health Maintenance: Pap: 09-30-18 Neg:Neg HR HPV, 07-24-15 Neg History of abnormal Pap:  no MMG:  n/a Colonoscopy:  n/a BMD:   n/a  Result  n/a TDaP:  2017 per patient Gardasil:   yes HIV: 08-29-21  NR Hep C: 08-29-21 Neg Screening Labs: PCP Flu vaccine:  recommended when she is not congested.    reports that she has never smoked. She has never used smokeless tobacco. She reports that she does not currently use alcohol. She reports that she does not use drugs.  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   Hormone disorder    enlarged thyroid   Iron deficiency     Psoriasis of scalp    Seasonal allergies    STD (sexually transmitted disease)    Tx'd for Chlamydia and Gonorrhea age 63   Syphilis 2020   Syphilis 2020    Past Surgical History:  Procedure Laterality Date   EYE SURGERY     to remove styes at age 89   MYOMECTOMY  05/21/2017   laparoscopic - labor and vaginal delivery not recommended by [facility]   wisdom teeth Bilateral     Current Outpatient Medications  Medication Sig Dispense Refill   acetaminophen (TYLENOL) 500 MG chewable tablet Chew 500 mg by mouth every 6 (six) hours as needed for pain.     Boric Acid Vaginal 600 MG SUPP Place 600 mg vaginally 2 (two) times a week. (Patient not taking: Reported on 09/10/2021) 30 suppository 1   clobetasol ointment (TEMOVATE) 6.21 % Apply 1 application topically as needed.      famciclovir (FAMVIR) 500 MG tablet Take two tablets (1000 mg) by mouth twice a day for one day as needed. 30 tablet 0   hydrochlorothiazide (HYDRODIURIL) 25 MG tablet Take 25 mg by mouth daily.     medroxyPROGESTERone (PROVERA) 10 MG tablet Take 1 tablet (10 mg total) by mouth daily. (Patient not taking: Reported on 09/10/2021) 10 tablet 0   triamcinolone ointment (KENALOG) 0.1 %  Daily to eczema, once resolved, continue only on weekends to prevent recurrences     No current facility-administered medications for this visit.    Family History  Problem Relation Age of Onset   Hypertension Mother    Thyroid disease Mother    Diabetes Father    Hypertension Father    Diabetes Maternal Grandmother    Hypertension Maternal Grandmother    Stroke Maternal Grandmother    Diabetes Maternal Grandfather    Hypertension Maternal Grandfather    Hypertension Paternal Grandmother    Hypertension Paternal Grandfather     Review of Systems  Genitourinary:  Positive for vaginal discharge (internal vaginal itching).  All other systems reviewed and are negative.  Exam:   BP 138/86   Pulse 66   Ht 5' 2.5" (1.588 m)   Wt 191 lb  (86.6 kg)   LMP 08/27/2021 (Exact Date)   SpO2 99%   BMI 34.38 kg/m     General appearance: alert, cooperative and appears stated age Head: normocephalic, without obvious abnormality, atraumatic Neck: no adenopathy, supple, symmetrical, trachea midline and thyroid normal to inspection and palpation Lungs: clear to auscultation bilaterally Breasts: normal appearance, no masses or tenderness, bilateral nipple inversion, No nipple discharge or bleeding, No axillary adenopathy Heart: regular rate and rhythm Abdomen: soft, non-tender; no masses, no organomegaly Extremities: extremities normal, atraumatic, no cyanosis or edema Skin: skin color, texture, turgor normal. No rashes or lesions Lymph nodes: cervical, supraclavicular, and axillary nodes normal. Neurologic: grossly normal  Pelvic: External genitalia:  no lesions              No abnormal inguinal nodes palpated.              Urethra:  normal appearing urethra with no masses, tenderness or lesions              Bartholins and Skenes: normal                 Vagina: normal appearing vagina with normal color and discharge, no lesions              Cervix: no lesions              Pap taken: no Bimanual Exam:  Uterus:  normal size, contour, position, consistency, mobility, non-tender              Adnexa: no mass, fullness, tenderness    Chaperone was present for exam:  Estill Bamberg, CMA  Assessment:   Well woman visit with gynecologic exam. Hx laparoscopic myomectomy.  Vaginitis.  Hx HSV II. Hx syphilis.  Hx GC/CT. HTN. Elevated A1C.  Congestion.   Plan: Mammogram screening discussed. Self breast awareness reviewed. Pap and HR HPV as above. Guidelines for Calcium, Vitamin D, regular exercise program including cardiovascular and weight bearing exercise. Wet prep:  positive for clue cells, negative trichomonas and negative yeast. Rx for Micronor.  Instructed in use.  Rx for lidocaine 5% ointment.  Rx for acyclovir.  Rx for Flagyl.    I recommend she pursue testing for Covid.  No flu vaccine today due to respiratory symptoms.   Follow up annually and prn.   After visit summary provided.

## 2021-09-10 ENCOUNTER — Other Ambulatory Visit: Payer: Self-pay

## 2021-09-10 ENCOUNTER — Ambulatory Visit (INDEPENDENT_AMBULATORY_CARE_PROVIDER_SITE_OTHER): Payer: BC Managed Care – PPO | Admitting: Obstetrics and Gynecology

## 2021-09-10 ENCOUNTER — Encounter: Payer: Self-pay | Admitting: Obstetrics and Gynecology

## 2021-09-10 VITALS — BP 138/86 | HR 66 | Ht 62.5 in | Wt 191.0 lb

## 2021-09-10 DIAGNOSIS — N760 Acute vaginitis: Secondary | ICD-10-CM | POA: Diagnosis not present

## 2021-09-10 DIAGNOSIS — Z01419 Encounter for gynecological examination (general) (routine) without abnormal findings: Secondary | ICD-10-CM | POA: Diagnosis not present

## 2021-09-10 LAB — WET PREP FOR TRICH, YEAST, CLUE

## 2021-09-10 MED ORDER — LIDOCAINE 5 % EX OINT: 1.0000 "application " | TOPICAL_OINTMENT | Freq: Three times a day (TID) | CUTANEOUS | 1 refills | Status: DC

## 2021-09-10 MED ORDER — NORETHINDRONE 0.35 MG PO TABS: 1.0000 | ORAL_TABLET | Freq: Every day | ORAL | 11 refills | Status: DC

## 2021-09-10 MED ORDER — METRONIDAZOLE 500 MG PO TABS: 500.0000 mg | ORAL_TABLET | Freq: Two times a day (BID) | ORAL | 0 refills | Status: DC

## 2021-09-10 MED ORDER — FAMCICLOVIR 500 MG PO TABS: ORAL_TABLET | ORAL | 1 refills | Status: DC

## 2021-09-10 NOTE — Patient Instructions (Signed)

## 2022-05-01 ENCOUNTER — Encounter: Payer: Self-pay | Admitting: Radiology

## 2022-05-01 ENCOUNTER — Ambulatory Visit: Payer: BC Managed Care – PPO | Admitting: Radiology

## 2022-05-01 VITALS — BP 118/82

## 2022-05-01 DIAGNOSIS — N898 Other specified noninflammatory disorders of vagina: Secondary | ICD-10-CM

## 2022-05-01 LAB — WET PREP FOR TRICH, YEAST, CLUE

## 2022-05-01 NOTE — Progress Notes (Signed)
      Subjective: Allison Bentley is a 36 y.o. female who complains of vaginal discharge and itching earlier this week with acidic odor. Symptoms are better. Just returned from a trip tp [place] where she was in river [place]. She is leaving for a month to visit [place] next week.   Review of Systems  All other systems reviewed and are negative.    Objective:  -Vulva: without lesions or discharge -Vagina: discharge present, wet prep obtained -Cervix: no lesion or discharge, no CMT -Perineum: no lesions -Uterus: Mobile, non tender -Adnexa: no masses or tenderness   Microscopic wet-mount exam shows negative for pathogens, normal epithelial cells.   Chaperone offered and declined.  Assessment:/Plan:  1. Vaginal discharge Reassured negative wet prep.   Likely leukorrhea If symptoms return may try baking soda soaks  Avoid the use of soaps or perfumed products in the peri area. Avoid tub baths and sitting in sweaty or wet clothing for prolonged periods of time.

## 2022-09-17 ENCOUNTER — Ambulatory Visit: Payer: BC Managed Care – PPO | Admitting: Obstetrics and Gynecology

## 2022-09-24 ENCOUNTER — Encounter: Payer: Self-pay | Admitting: Obstetrics and Gynecology

## 2022-09-24 ENCOUNTER — Other Ambulatory Visit (HOSPITAL_COMMUNITY)
Admission: RE | Admit: 2022-09-24 | Discharge: 2022-09-24 | Disposition: A | Discharge status: Home / Self Care | Payer: BC Managed Care – PPO | Source: Ambulatory Visit | Attending: Obstetrics and Gynecology | Admitting: Obstetrics and Gynecology

## 2022-09-24 ENCOUNTER — Ambulatory Visit (INDEPENDENT_AMBULATORY_CARE_PROVIDER_SITE_OTHER): Payer: BC Managed Care – PPO | Admitting: Obstetrics and Gynecology

## 2022-09-24 VITALS — BP 123/87 | HR 60 | Ht 62.5 in | Wt 174.0 lb

## 2022-09-24 DIAGNOSIS — Z01419 Encounter for gynecological examination (general) (routine) without abnormal findings: Secondary | ICD-10-CM

## 2022-09-24 DIAGNOSIS — N898 Other specified noninflammatory disorders of vagina: Secondary | ICD-10-CM | POA: Insufficient documentation

## 2022-09-24 DIAGNOSIS — Z23 Encounter for immunization: Secondary | ICD-10-CM

## 2022-09-24 MED ORDER — LIDOCAINE 5 % EX OINT: 1.0000 | TOPICAL_OINTMENT | Freq: Three times a day (TID) | CUTANEOUS | 1 refills | Status: AC

## 2022-09-24 MED ORDER — FAMCICLOVIR 500 MG PO TABS: ORAL_TABLET | ORAL | 1 refills | Status: DC

## 2022-09-24 NOTE — Progress Notes (Signed)
36 y.o. G0P0 Single African American female here for annual exam.  Pt is worried about acidic smelling discharge and odor. She mentioned discussing in June with her last visit. Symptoms started last year.  Not using any feminine sprays.  Wants evaluation.  She had vaginitis testing which was negative in June, 2023.   Glucose 92 on labs done 08/19/22 through [facility].  A1C 6.4 on 01/07/22.  She is on Metformin.   Not taking Micronor.  She is not sexually active.   Declines STD screening.  No partner in the last year.   Needs refill on Famvir.  Takes as needed.   PCP:   Jenny Reichmann, MD  Patient's last menstrual period was 09/15/2022.     Period Cycle (Days): 25 Period Duration (Days): 4 Menstrual Flow: Moderate Menstrual Control: Maxi pad Dysmenorrhea: None     Sexually active: No.  The current method of family planning is abstinence.    Exercising: No.   Smoker:  no  Health Maintenance: Pap:  09-30-18 Neg:Neg HR HPV, 07-24-15 Neg  History of abnormal Pap:  no MMG:  n/a Colonoscopy:  n/a BMD:   n/a  Result  n/a TDaP:  04/02/2022 Gardasil:   yes HIV: 08/29/21 NR Hep C: 08/29/21 Neg Screening Labs:  PCP Flu vaccine:  today.  Covid vaccine:  discussed.    reports that she has never smoked. She has never used smokeless tobacco. She reports that she does not currently use alcohol. She reports that she does not use drugs.  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   Hormone disorder    enlarged thyroid   Iron deficiency    Psoriasis of scalp    Seasonal allergies    STD (sexually transmitted disease)    Tx'd for Chlamydia and Gonorrhea age 40   Syphilis 2020   Syphilis 2020    Past Surgical History:  Procedure Laterality Date   EYE SURGERY     to remove styes at age 40   MYOMECTOMY   05/21/2017   laparoscopic - labor and vaginal delivery not recommended by [facility]   wisdom teeth Bilateral     Current Outpatient Medications  Medication Sig Dispense Refill   acetaminophen (TYLENOL) 500 MG chewable tablet Chew 500 mg by mouth every 6 (six) hours as needed for pain.     hydrochlorothiazide (HYDRODIURIL) 25 MG tablet Take 25 mg by mouth daily.     metFORMIN (GLUCOPHAGE) 500 MG tablet Take by mouth.     mometasone (ELOCON) 0.1 % ointment Apply topically.     triamcinolone ointment (KENALOG) 0.1 % Daily to eczema, once resolved, continue only on weekends to prevent recurrences     clobetasol ointment (TEMOVATE) 6.29 % Apply 1 application topically as needed.  (Patient not taking: Reported on 05/01/2022)     famciclovir (FAMVIR) 500 MG tablet Take two tablets (1000 mg) by mouth twice a day for one day as needed. 30 tablet 1   lidocaine (XYLOCAINE) 5 % ointment Apply 1 Application topically 3 (three) times daily. 1.25 g 1   pantoprazole (PROTONIX) 20 MG tablet Take 20 mg by mouth daily.     No current facility-administered medications for this visit.    Family History  Problem  Relation Age of Onset   Hypertension Mother    Thyroid disease Mother    Diabetes Father    Hypertension Father    Diabetes Maternal Grandmother    Hypertension Maternal Grandmother    Stroke Maternal Grandmother    Diabetes Maternal Grandfather    Hypertension Maternal Grandfather    Hypertension Paternal Grandmother    Hypertension Paternal Grandfather     Review of Systems  Genitourinary:  Positive for vaginal discharge.    Exam:   BP 123/87 (BP Location: Left Arm, Patient Position: Sitting, Cuff Size: Normal)   Pulse 60   Ht 5' 2.5" (1.588 m)   Wt 174 lb (78.9 kg)   LMP 09/15/2022   BMI 31.32 kg/m     General appearance: alert, cooperative and appears stated age Head: normocephalic, without obvious abnormality, atraumatic Neck: no adenopathy, supple, symmetrical, trachea midline and  thyroid normal to inspection and palpation Lungs: clear to auscultation bilaterally Breasts: normal appearance, no masses or tenderness, No nipple retraction or dimpling, No nipple discharge or bleeding, No axillary adenopathy Heart: regular rate and rhythm Abdomen: soft, non-tender; no masses, no organomegaly Extremities: extremities normal, atraumatic, no cyanosis or edema Skin: skin color, texture, turgor normal. No rashes or lesions Lymph nodes: cervical, supraclavicular, and axillary nodes normal. Neurologic: grossly normal  Pelvic: External genitalia:  no lesions              No abnormal inguinal nodes palpated.              Urethra:  normal appearing urethra with no masses, tenderness or lesions              Bartholins and Skenes: normal                 Vagina: normal appearing vagina with normal color and discharge, no lesions              Cervix: no lesions              Pap taken: no Bimanual Exam:  Uterus:  normal size, contour, position, consistency, mobility, non-tender              Adnexa: no mass, fullness, tenderness     Chaperone was present for exam:  Raquel Sarna  Assessment:   Well woman visit with gynecologic exam. Hx laparoscopic myomectomy.  Hx HSV II. Hx syphilis.  Hx GC/CT. HTN. Elevated A1C.  On Metformin.   Plan: Mammogram screening discussed. Self breast awareness reviewed. Pap and HR HPV as above. Guidelines for Calcium, Vitamin D, regular exercise program including cardiovascular and weight bearing exercise. Refill of Famvir and Lidocaine.  She declines refill of Micronor.  Flu vaccine administered.  Follow up annually and prn.   After visit summary provided.   15 min  total time was spent for this patient encounter, including preparation, face-to-face counseling with the patient, coordination of care, and documentation of the encounter in addition to performing annual exam.  Vaginitis testing collected.  We discussed forms of vaginitis and well as  leukorrhea.  Prefers Diflucan if needed for yeast and Flagy if needed for BV.  Avoid vaginal products and douching.

## 2022-09-24 NOTE — Patient Instructions (Signed)

## 2022-09-25 LAB — CERVICOVAGINAL ANCILLARY ONLY
Bacterial Vaginitis (gardnerella): NEGATIVE
Candida Glabrata: NEGATIVE
Candida Vaginitis: NEGATIVE
Comment: NEGATIVE
Comment: NEGATIVE
Comment: NEGATIVE
Comment: NEGATIVE
Trichomonas: NEGATIVE

## 2023-11-03 ENCOUNTER — Encounter: Payer: Self-pay | Admitting: Family

## 2023-12-04 ENCOUNTER — Ambulatory Visit: Payer: 59 | Admitting: Nurse Practitioner

## 2023-12-04 ENCOUNTER — Encounter: Payer: Self-pay | Admitting: Family

## 2023-12-04 ENCOUNTER — Encounter: Payer: Self-pay | Admitting: Nurse Practitioner

## 2023-12-04 VITALS — BP 122/78 | HR 74 | Wt 195.0 lb

## 2023-12-04 DIAGNOSIS — N760 Acute vaginitis: Secondary | ICD-10-CM

## 2023-12-04 DIAGNOSIS — Z113 Encounter for screening for infections with a predominantly sexual mode of transmission: Secondary | ICD-10-CM | POA: Diagnosis not present

## 2023-12-04 DIAGNOSIS — N898 Other specified noninflammatory disorders of vagina: Secondary | ICD-10-CM

## 2023-12-04 DIAGNOSIS — B9689 Other specified bacterial agents as the cause of diseases classified elsewhere: Secondary | ICD-10-CM

## 2023-12-04 LAB — WET PREP FOR TRICH, YEAST, CLUE

## 2023-12-04 MED ORDER — METRONIDAZOLE 500 MG PO TABS: 500.0000 mg | ORAL_TABLET | Freq: Two times a day (BID) | ORAL | 0 refills | Status: DC

## 2023-12-04 NOTE — Progress Notes (Signed)
   Acute Office Visit  Subjective:    Patient ID: Allison Bentley, female    DOB: [birth year], 38 y.o.   MRN: [id number]   HPI 38 y.o. presents today for vaginal discharge and odor x 2 days. New sexual partner. Wondering why she gets BV. Has used boric acid in the past.   Patient's last menstrual period was 11/23/2023 (approximate).    Review of Systems  Constitutional: Negative.   Genitourinary:  Positive for vaginal discharge. Negative for dysuria, flank pain, frequency, genital sores, hematuria, urgency and vaginal pain.       Vaginal odor       Objective:    Physical Exam Constitutional:      Appearance: Normal appearance.  Genitourinary:    General: Normal vulva.     Vagina: Vaginal discharge present. No erythema.     BP 122/78 (BP Location: Right Arm, Patient Position: Sitting, Cuff Size: Normal)   Pulse 74   Wt 195 lb (88.5 kg)   LMP 11/23/2023 (Approximate)   SpO2 99%   BMI 35.10 kg/m  Wt Readings from Last 3 Encounters:  12/04/23 195 lb (88.5 kg)  09/24/22 174 lb (78.9 kg)  09/10/21 191 lb (86.6 kg)        Patient informed chaperone available to be present for breast and/or pelvic exam. Patient has requested no chaperone to be present. Patient has been advised what will be completed during breast and pelvic exam.   Wet prep + clue cells (+ odor)  Assessment & Plan:   Problem List Items Addressed This Visit   None Visit Diagnoses       Bacterial vaginosis    -  Primary   Relevant Medications   metroNIDAZOLE (FLAGYL) 500 MG tablet     Vaginal discharge       Relevant Orders   WET PREP FOR TRICH, YEAST, CLUE     Screening examination for STD (sexually transmitted disease)       Relevant Orders   C. trachomatis/N. gonorrhoeae RNA      Plan: Wet prep positive for clue cells - Flagyl 500 mg BID x 7 days. Discussed women's probiotic and boric acid use. GC/CT pending.   Return if symptoms worsen or fail to improve.    Olivia Mackie DNP, 12:02  PM 12/04/2023

## 2023-12-05 LAB — C. TRACHOMATIS/N. GONORRHOEAE RNA
C. trachomatis RNA, TMA: NOT DETECTED
N. gonorrhoeae RNA, TMA: NOT DETECTED

## 2023-12-08 ENCOUNTER — Encounter: Payer: Self-pay | Admitting: Nurse Practitioner

## 2023-12-12 ENCOUNTER — Other Ambulatory Visit: Payer: Self-pay | Admitting: Obstetrics and Gynecology

## 2023-12-12 NOTE — Telephone Encounter (Signed)
 Med refill request:famciclovir  500 mg tab, 2 tablets PO BID for one day as needed   Hx HSV2 Last AEX: 09/24/22-BS; OV TW 12/04/23 Next AEX: 33/11/25- BS Last MMG (if hormonal med) Refill authorized: Please Advise?

## 2023-12-15 ENCOUNTER — Other Ambulatory Visit: Payer: Self-pay | Admitting: Nurse Practitioner

## 2023-12-15 DIAGNOSIS — N760 Acute vaginitis: Secondary | ICD-10-CM

## 2023-12-15 MED ORDER — FLUCONAZOLE 150 MG PO TABS: 150.0000 mg | ORAL_TABLET | ORAL | 0 refills | Status: DC

## 2023-12-18 ENCOUNTER — Ambulatory Visit (INDEPENDENT_AMBULATORY_CARE_PROVIDER_SITE_OTHER): Payer: 59 | Admitting: Nurse Practitioner

## 2023-12-18 VITALS — BP 104/66 | HR 79 | Wt 196.0 lb

## 2023-12-18 DIAGNOSIS — B3731 Acute candidiasis of vulva and vagina: Secondary | ICD-10-CM | POA: Diagnosis not present

## 2023-12-18 DIAGNOSIS — N898 Other specified noninflammatory disorders of vagina: Secondary | ICD-10-CM

## 2023-12-18 LAB — WET PREP FOR TRICH, YEAST, CLUE

## 2023-12-18 MED ORDER — FLUCONAZOLE 150 MG PO TABS: 150.0000 mg | ORAL_TABLET | ORAL | 0 refills | Status: AC

## 2023-12-18 NOTE — Progress Notes (Signed)
   Acute Office Visit  Subjective:    Patient ID: Allison Bentley, female    DOB: 1986/10/16, 38 y.o.   MRN: [id number]   HPI 38 y.o. presents today for follow up for vaginitis. Treated for BV 12/04/23. Those symptoms improved but then started having symptoms of yeast. Took second dose of Diflucan today. Does feel like symptoms are improving now. Negative STD screening 12/04/23.  Patient's last menstrual period was 12/18/2023 (exact date).    Review of Systems  Constitutional: Negative.   Genitourinary:  Positive for vaginal pain. Negative for vaginal discharge.       Objective:    Physical Exam Constitutional:      Appearance: Normal appearance.  Genitourinary:    General: Normal vulva.     Vagina: Vaginal discharge present. No erythema.     Cervix: Normal.     BP 104/66 (BP Location: Left Arm, Patient Position: Sitting, Cuff Size: Large)   Pulse 79   Wt 196 lb (88.9 kg)   LMP 12/18/2023 (Exact Date)   SpO2 98%   BMI 35.28 kg/m  Wt Readings from Last 3 Encounters:  12/18/23 196 lb (88.9 kg)  12/04/23 195 lb (88.5 kg)  09/24/22 174 lb (78.9 kg)        Patient informed chaperone available to be present for breast and/or pelvic exam. Patient has requested no chaperone to be present. Patient has been advised what will be completed during breast and pelvic exam.   Wet prep + yeast  Assessment & Plan:   Problem List Items Addressed This Visit   None Visit Diagnoses       Vaginal candidiasis    -  Primary   Relevant Medications   fluconazole (DIFLUCAN) 150 MG tablet     Vaginal itching       Relevant Orders   WET PREP FOR TRICH, YEAST, CLUE      Plan: Wet prep positive for yeast. Could be residual from recent infection. Took Diflucan this morning. Provided additional Diflucan if symptoms continue after a few days.   Return if symptoms worsen or fail to improve.    Olivia Mackie DNP, 2:12 PM 12/18/2023

## 2023-12-30 NOTE — Progress Notes (Signed)
 38 y.o. G0P0 Single African American female here for annual exam.    No period problems.  Cycles last 3 - 4 days, which is shorter than they used to be.  First 2 days are heavy. Her menstrual period is light today.    Had BV and then yeast infection recently.  Neg GC/CT on 12/04/23  Neg trich testing on 12/18/23.   Trying to get her A1C under control. Taking Metformin.   Brother passed away last fall, and she is grieving the loss.  He was 16 years older.  Her brother named her.   She is going to be in a friend's wedding.   Works for [employer] now.   PCP: Amil Amen, MD   Patient's last menstrual period was 01/11/2024 (exact date).           Sexually active: Yes.    The current method of family planning is condoms.    Menopausal hormone therapy:  n/a Exercising: No.   Smoker:  no  OB History  Gravida Para Term Preterm AB Living  0       SAB IAB Ectopic Multiple Live Births           HEALTH MAINTENANCE: Last 2 paps:  09/30/18 neg: HR HPV neg, 07/24/15 neg History of abnormal Pap or positive HPV:  no Mammogram:   n/a Colonoscopy:  n/a Bone Density:  n/a  Result  n/a   Immunization History  Administered Date(s) Administered   Influenza,inj,Quad PF,6+ Mos 10/05/2019, 09/24/2022   PFIZER Comirnaty(Gray Top)Covid-19 Tri-Sucrose Vaccine 12/20/2020   PFIZER(Purple Top)SARS-COV-2 Vaccination 02/03/2020, 02/28/2020      reports that she has never smoked. She has never used smokeless tobacco. She reports that she does not currently use alcohol. She reports that she does not use drugs.  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   Hormone disorder    enlarged thyroid   Iron deficiency    Psoriasis of scalp    Seasonal allergies    STD (sexually transmitted disease)    Tx'd for  Chlamydia and Gonorrhea age 31   Syphilis 2020   Syphilis 2020    Past Surgical History:  Procedure Laterality Date   EYE SURGERY     to remove styes at age 51   MYOMECTOMY  05/21/2017   laparoscopic - labor and vaginal delivery not recommended by [facility]   wisdom teeth Bilateral     Current Outpatient Medications  Medication Sig Dispense Refill   acetaminophen (TYLENOL) 500 MG chewable tablet Chew 500 mg by mouth every 6 (six) hours as needed for pain.     clindamycin (CLEOCIN T) 1 % lotion Apply to affected areas daily as needed.     clobetasol ointment (TEMOVATE) 0.05 % Apply 1 application  topically as needed.     cyclobenzaprine (FLEXERIL) 5 MG tablet Take by mouth.     diclofenac Sodium (VOLTAREN) 1 % GEL      famciclovir (FAMVIR) 500 MG tablet TAKE 2 TABLETS BY MOUTH TWICE A DAY FOR ONE DAY AS NEEDED FOR AN OUTBREAK. 30 tablet 0   fluconazole (DIFLUCAN) 150 MG tablet Take 1 tablet (150 mg total) by mouth every 3 (three) days. 2 tablet 0  hydrochlorothiazide (HYDRODIURIL) 25 MG tablet Take 25 mg by mouth daily.     lidocaine (XYLOCAINE) 5 % ointment Apply 1 Application topically 3 (three) times daily. 1.25 g 1   metFORMIN (GLUCOPHAGE) 500 MG tablet Take by mouth.     metroNIDAZOLE (FLAGYL) 500 MG tablet Take 1 tablet (500 mg total) by mouth 2 (two) times daily. 14 tablet 0   mometasone (ELOCON) 0.1 % ointment Apply topically.     pantoprazole (PROTONIX) 20 MG tablet Take 20 mg by mouth daily.     triamcinolone ointment (KENALOG) 0.1 % Daily to eczema, once resolved, continue only on weekends to prevent recurrences     No current facility-administered medications for this visit.    ALLERGIES: Acyclovir and related, Lactose intolerance (gi), Ampicillin, Pollen extract, and Valtrex [valacyclovir]  Family History  Problem Relation Age of Onset   Hypertension Mother    Thyroid disease Mother    Diabetes Father    Hypertension Father    Diabetes Maternal Grandmother     Hypertension Maternal Grandmother    Stroke Maternal Grandmother    Diabetes Maternal Grandfather    Hypertension Maternal Grandfather    Hypertension Paternal Grandmother    Hypertension Paternal Grandfather     Review of Systems  All other systems reviewed and are negative.   PHYSICAL EXAM:  BP 130/82 (BP Location: Left Arm, Patient Position: Sitting, Cuff Size: Small)   Pulse 79   Ht 5\' 3"  (1.6 m)   Wt 193 lb (87.5 kg)   LMP 01/11/2024 (Exact Date)   SpO2 99%   BMI 34.19 kg/m     General appearance: alert, cooperative and appears stated age Head: normocephalic, without obvious abnormality, atraumatic Neck: no adenopathy, supple, symmetrical, trachea midline and thyroid normal to inspection and palpation Lungs: clear to auscultation bilaterally Breasts: normal appearance, no masses or tenderness, bilateral nipple inversion (old change). No nipple discharge or bleeding, No axillary adenopathy Heart: regular rate and rhythm Abdomen: soft, non-tender; no masses, no organomegaly Extremities: extremities normal, atraumatic, no cyanosis or edema Skin: skin color, texture, turgor normal. No rashes or lesions Lymph nodes: cervical, supraclavicular, and axillary nodes normal. Neurologic: grossly normal  Pelvic: External genitalia:  no lesions              No abnormal inguinal nodes palpated.              Urethra:  normal appearing urethra with no masses, tenderness or lesions              Bartholins and Skenes: normal                 Vagina: normal appearing vagina with normal color and discharge, no lesions              Cervix: no lesions.  Menstrual bleeding.               Pap taken: No.  Too much bleeding for pap to be done.  Bimanual Exam:  Uterus:  possible 3 cm anterior LUS fibroid, nontender.               Adnexa: no mass, fullness, tenderness    Chaperone was present for exam:  Warren Lacy, CMA  ASSESSMENT: Well woman visit with gynecologic exam. Hx laparoscopic  myomectomy.  Possible fibroid on exam today. Current menstruation.  Hx HSV II. Hx syphilis.  Hx GC/CT in past.  HTN. Elevated A1C.  On Metformin.  Nipple inversion bilaterally.  PHQ-9: 3 Bereavement.  PLAN: Mammogram screening discussed. Self breast awareness reviewed. Pap and HRV collected:  no.  Guidelines for Calcium, Vitamin D, regular exercise program including cardiovascular and weight bearing exercise. Medication refills:  Famvir prn outbreak.  No pelvic US for now.  Would do it develops abnormal bleeding or desires pregnancy.  Patient is in agreement.  HIV, RPR, hep C aby.  Support given for the loss of her brother.  Return for pap and HR HPV testing in 1 - 2 weeks.  Follow up:  for yearly exam and prn.

## 2024-01-13 ENCOUNTER — Encounter: Payer: Self-pay | Admitting: Obstetrics and Gynecology

## 2024-01-13 ENCOUNTER — Ambulatory Visit (INDEPENDENT_AMBULATORY_CARE_PROVIDER_SITE_OTHER): Payer: BC Managed Care – PPO | Admitting: Obstetrics and Gynecology

## 2024-01-13 VITALS — BP 130/82 | HR 79 | Ht 63.0 in | Wt 193.0 lb

## 2024-01-13 DIAGNOSIS — Z114 Encounter for screening for human immunodeficiency virus [HIV]: Secondary | ICD-10-CM

## 2024-01-13 DIAGNOSIS — Z1331 Encounter for screening for depression: Secondary | ICD-10-CM | POA: Diagnosis not present

## 2024-01-13 DIAGNOSIS — Z1159 Encounter for screening for other viral diseases: Secondary | ICD-10-CM

## 2024-01-13 DIAGNOSIS — Z01419 Encounter for gynecological examination (general) (routine) without abnormal findings: Secondary | ICD-10-CM | POA: Diagnosis not present

## 2024-01-13 DIAGNOSIS — Z113 Encounter for screening for infections with a predominantly sexual mode of transmission: Secondary | ICD-10-CM | POA: Diagnosis not present

## 2024-01-13 MED ORDER — FAMCICLOVIR 500 MG PO TABS: ORAL_TABLET | ORAL | 1 refills | Status: AC

## 2024-01-13 NOTE — Patient Instructions (Addendum)
 Consider [facility] or [facility].     EXERCISE AND DIET:  We recommended that you start or continue a regular exercise program for good health. Regular exercise means any activity that makes your heart beat faster and makes you sweat.  We recommend exercising at least 30 minutes per day at least 3 days a week, preferably 4 or 5.  We also recommend a diet low in fat and sugar.  Inactivity, poor dietary choices and obesity can cause diabetes, heart attack, stroke, and kidney damage, among others.    ALCOHOL AND SMOKING:  Women should limit their alcohol intake to no more than 7 drinks/beers/glasses of wine (combined, not each!) per week. Moderation of alcohol intake to this level decreases your risk of breast cancer and liver damage. And of course, no recreational drugs are part of a healthy lifestyle.  And absolutely no smoking or even second hand smoke. Most people know smoking can cause heart and lung diseases, but did you know it also contributes to weakening of your bones? Aging of your skin?  Yellowing of your teeth and nails?  CALCIUM AND VITAMIN D:  Adequate intake of calcium and Vitamin D are recommended.  The recommendations for exact amounts of these supplements seem to change often, but generally speaking 600 mg of calcium (either carbonate or citrate) and 800 units of Vitamin D per day seems prudent. Certain women may benefit from higher intake of Vitamin D.  If you are among these women, your doctor will have told you during your visit.    PAP SMEARS:  Pap smears, to check for cervical cancer or precancers,  have traditionally been done yearly, although recent scientific advances have shown that most women can have pap smears less often.  However, every woman still should have a physical exam from her gynecologist every year. It will include a breast check, inspection of the vulva and vagina to check for abnormal growths or skin changes, a visual exam of the cervix, and then an exam to  evaluate the size and shape of the uterus and ovaries.  And after 36 years of age, a rectal exam is indicated to check for rectal cancers. We will also provide age appropriate advice regarding health maintenance, like when you should have certain vaccines, screening for sexually transmitted diseases, bone density testing, colonoscopy, mammograms, etc.   MAMMOGRAMS:  All women over 36 years old should have a yearly mammogram. Many facilities now offer a "3D" mammogram, which may cost around $50 extra out of pocket. If possible,  we recommend you accept the option to have the 3D mammogram performed.  It both reduces the number of women who will be called back for extra views which then turn out to be normal, and it is better than the routine mammogram at detecting truly abnormal areas.    COLONOSCOPY:  Colonoscopy to screen for colon cancer is recommended for all women at age 36.  We know, you hate the idea of the prep.  We agree, BUT, having colon cancer and not knowing it is worse!!  Colon cancer so often starts as a polyp that can be seen and removed at colonscopy, which can quite literally save your life!  And if your first colonoscopy is normal and you have no family history of colon cancer, most women don't have to have it again for 10 years.  Once every ten years, you can do something that may end up saving your life, right?  We will be happy to  help you get it scheduled when you are ready.  Be sure to check your insurance coverage so you understand how much it will cost.  It may be covered as a preventative service at no cost, but you should check your particular policy.

## 2024-01-14 LAB — HIV ANTIBODY (ROUTINE TESTING W REFLEX): HIV 1&2 Ab, 4th Generation: NONREACTIVE

## 2024-01-14 LAB — RPR: RPR Ser Ql: NONREACTIVE

## 2024-01-14 LAB — HEPATITIS C ANTIBODY: Hepatitis C Ab: NONREACTIVE

## 2024-01-16 ENCOUNTER — Encounter: Payer: Self-pay | Admitting: Obstetrics and Gynecology

## 2024-01-22 ENCOUNTER — Ambulatory Visit (INDEPENDENT_AMBULATORY_CARE_PROVIDER_SITE_OTHER): Admitting: Radiology

## 2024-01-22 VITALS — BP 116/78 | Wt 193.6 lb

## 2024-01-22 DIAGNOSIS — N898 Other specified noninflammatory disorders of vagina: Secondary | ICD-10-CM | POA: Diagnosis not present

## 2024-01-22 LAB — WET PREP FOR TRICH, YEAST, CLUE

## 2024-01-22 NOTE — Progress Notes (Signed)
      Subjective: Allison Bentley is a 38 y.o. female who complains of vaginal discharge, odor, no itching. Symptoms began a few days ago. Recent STI screen was negative.   Review of Systems  All other systems reviewed and are negative.   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   Hormone disorder    enlarged thyroid   Iron deficiency    Psoriasis of scalp    Seasonal allergies    STD (sexually transmitted disease)    Tx'd for Chlamydia and Gonorrhea age 79   Syphilis 2020   Syphilis 2020      Objective:  Today's Vitals   01/22/24 1109  BP: 116/78  Weight: 193 lb 9.6 oz (87.8 kg)   Body mass index is 34.29 kg/m.   Physical Exam Vitals and nursing note reviewed. Exam conducted with a chaperone present.  Constitutional:      Appearance: Normal appearance. She is well-developed.  Pulmonary:     Effort: Pulmonary effort is normal.  Abdominal:     General: Abdomen is flat.     Palpations: Abdomen is soft.  Genitourinary:    General: Normal vulva.     Vagina: Vaginal discharge present. No erythema, bleeding or lesions.     Cervix: Normal. No discharge, friability, lesion or erythema.     Uterus: Normal.      Adnexa: Right adnexa normal and left adnexa normal.  Neurological:     Mental Status: She is alert.  Psychiatric:        Mood and Affect: Mood normal.        Thought Content: Thought content normal.        Judgment: Judgment normal.      Microscopic wet-mount exam shows negative for pathogens, normal epithelial cells.   Raynelle Fanning, CMA present for exam  Assessment:/Plan:   1. Vaginal discharge (Primary) Reassured negative May try Boric acid suppositories once weekly to maintain vaginal balance - WET PREP FOR TRICH, YEAST, CLUE    Walaa Carel B, NP 11:33 AM

## 2024-02-04 NOTE — Progress Notes (Signed)
 GYNECOLOGY  VISIT   HPI: 38 y.o.   Single  African American female   G0P0 with Patient's last menstrual period was 02/05/2024.   here for: pap.  She had too much bleeding at the time of her annual exam on 01/13/24 to collect a pap.      Took Gardasil vaccine.  Wants to loose weight.  Used to enjoy working out with a Psychologist, educational.   GYNECOLOGIC HISTORY: Patient's last menstrual period was 02/05/2024. Contraception:  condoms Menopausal hormone therapy:  n/a Last 2 paps:  09/30/18 neg: HR HPV neg, 07/24/15 neg  History of abnormal Pap or positive HPV:  no Mammogram:  n/a        OB History     Gravida  0   Para      Term      Preterm      AB      Living         SAB      IAB      Ectopic      Multiple      Live Births                 Patient Active Problem List   Diagnosis Date Noted   Prediabetes 11/28/2020   History of syphilis 04/06/2020   Menorrhagia 12/31/2018   Iron deficiency anemia due to chronic blood loss 12/28/2018   HSV (herpes simplex virus) infection 06/05/2017   Seborrheic dermatitis 03/04/2014    Past Medical History:  Diagnosis Date   Dysmenorrhea    Eczema    arms and neck   Elevated hemoglobin A1c    Fibroid    Heart murmur    dx'd as a teenager and told would outgrow   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   Hormone disorder    enlarged thyroid   Iron deficiency    Psoriasis of scalp    Seasonal allergies    STD (sexually transmitted disease)    Tx'd for Chlamydia and Gonorrhea age 56   Syphilis 2020   Syphilis 2020    Past Surgical History:  Procedure Laterality Date   EYE SURGERY     to remove styes at age 89   MYOMECTOMY  05/21/2017   laparoscopic - labor and vaginal delivery not recommended by [facility]   wisdom teeth Bilateral     Current Outpatient Medications  Medication Sig Dispense Refill   acetaminophen (TYLENOL) 500 MG chewable tablet Chew 500 mg by mouth every 6 (six) hours as needed for pain.     clindamycin  (CLEOCIN T) 1 % lotion Apply to affected areas daily as needed.     clobetasol ointment (TEMOVATE) 0.05 % Apply 1 application  topically as needed.     cyclobenzaprine (FLEXERIL) 5 MG tablet Take by mouth.     diclofenac Sodium (VOLTAREN) 1 % GEL      famciclovir (FAMVIR) 500 MG tablet TAKE 2 TABLETS BY MOUTH TWICE A DAY FOR ONE DAY AS NEEDED FOR AN OUTBREAK. 30 tablet 1   fluconazole (DIFLUCAN) 150 MG tablet Take 1 tablet (150 mg total) by mouth every 3 (three) days. 2 tablet 0   hydrochlorothiazide (HYDRODIURIL) 25 MG tablet Take 25 mg by mouth daily.     lidocaine (XYLOCAINE) 5 % ointment Apply 1 Application topically 3 (three) times daily. 1.25 g 1   metFORMIN (GLUCOPHAGE) 500 MG tablet Take by mouth.     metroNIDAZOLE (FLAGYL) 500 MG tablet Take 1 tablet (  500 mg total) by mouth 2 (two) times daily. 14 tablet 0   mometasone (ELOCON) 0.1 % ointment Apply topically.     pantoprazole (PROTONIX) 20 MG tablet Take 20 mg by mouth daily.     triamcinolone ointment (KENALOG) 0.1 % Daily to eczema, once resolved, continue only on weekends to prevent recurrences     No current facility-administered medications for this visit.     ALLERGIES: Acyclovir and related, Lactose intolerance (gi), Ampicillin, Pollen extract, and Valtrex [valacyclovir]  Family History  Problem Relation Age of Onset   Hypertension Mother    Thyroid disease Mother    Diabetes Father    Hypertension Father    Diabetes Maternal Grandmother    Hypertension Maternal Grandmother    Stroke Maternal Grandmother    Diabetes Maternal Grandfather    Hypertension Maternal Grandfather    Hypertension Paternal Grandmother    Hypertension Paternal Grandfather     Social History   Socioeconomic History   Marital status: Single    Spouse name: Not on file   Number of children: Not on file   Years of education: Not on file   Highest education level: Not on file  Occupational History   Not on file  Tobacco Use   Smoking  status: Never   Smokeless tobacco: Never  Vaping Use   Vaping status: Never Used  Substance and Sexual Activity   Alcohol use: Not Currently    Alcohol/week: 0.0 standard drinks of alcohol   Drug use: No   Sexual activity: Yes    Partners: Male    Birth control/protection: Condom    Comment: condoms most of the time  Other Topics Concern   Not on file  Social History Narrative   Not on file   Social Drivers of Health   Financial Resource Strain: Not on file  Food Insecurity: Not on file  Transportation Needs: Not on file  Physical Activity: Not on file  Stress: Not on file  Social Connections: Unknown (03/03/2022)   Received from [facility]   Social Network    Social Network: Not on file  Intimate Partner Violence: Unknown (02/05/2022)   Received from [facility]   HITS    Physically Hurt: Not on file    Insult or Talk Down To: Not on file    Threaten Physical Harm: Not on file    Scream or Curse: Not on file    Review of Systems  All other systems reviewed and are negative.   PHYSICAL EXAMINATION:   BP 138/88 (BP Location: Left Arm, Patient Position: Sitting, Cuff Size: Small)   Pulse 88   Ht 5\' 3"  (1.6 m)   Wt 197 lb (89.4 kg)   LMP 02/05/2024   SpO2 99%   BMI 34.90 kg/m     General appearance: alert, cooperative and appears stated age   Pelvic: External genitalia:  no lesions              Urethra:  normal appearing urethra with no masses, tenderness or lesions              Bartholins and Skenes: normal                 Vagina: normal appearing vagina with normal color and discharge, no lesions              Cervix: no lesions.  Pap collected.  Bimanual Exam:  Uterus:  normal size, contour, position, consistency, mobility, non-tender              Adnexa: no mass, fullness, tenderness            Chaperone was present for exam:  Emmaline Haring, CMA  ASSESSMENT:  Cervical cancer screening.  No anterior fibroid  palpable on exam today.   PLAN:  Pap and HR HPV collected.  Monitor for symptoms of heavy menstrual bleeding, longer cycles, or bleeding in between periods.  Start physical fitness plan.   Follow up for yearly exam and prn.   13 min  total time was spent for this patient encounter, including preparation, face-to-face counseling with the patient, coordination of care, and documentation of the encounter.

## 2024-02-18 ENCOUNTER — Other Ambulatory Visit (HOSPITAL_COMMUNITY)
Admission: RE | Admit: 2024-02-18 | Discharge: 2024-02-18 | Disposition: A | Discharge status: Home / Self Care | Source: Ambulatory Visit | Attending: Obstetrics and Gynecology | Admitting: Obstetrics and Gynecology

## 2024-02-18 ENCOUNTER — Ambulatory Visit (INDEPENDENT_AMBULATORY_CARE_PROVIDER_SITE_OTHER): Admitting: Obstetrics and Gynecology

## 2024-02-18 ENCOUNTER — Encounter: Payer: Self-pay | Admitting: Obstetrics and Gynecology

## 2024-02-18 VITALS — BP 138/88 | HR 88 | Ht 63.0 in | Wt 197.0 lb

## 2024-02-18 DIAGNOSIS — Z124 Encounter for screening for malignant neoplasm of cervix: Secondary | ICD-10-CM

## 2024-02-23 ENCOUNTER — Encounter: Payer: Self-pay | Admitting: Obstetrics and Gynecology

## 2024-02-23 DIAGNOSIS — B9689 Other specified bacterial agents as the cause of diseases classified elsewhere: Secondary | ICD-10-CM

## 2024-02-23 LAB — CYTOLOGY - PAP
Adequacy: ABSENT
Comment: NEGATIVE
Diagnosis: UNDETERMINED — AB
High risk HPV: NEGATIVE

## 2024-02-24 MED ORDER — METRONIDAZOLE 500 MG PO TABS: 500.0000 mg | ORAL_TABLET | Freq: Two times a day (BID) | ORAL | 0 refills | Status: AC

## 2024-02-25 ENCOUNTER — Encounter: Payer: Self-pay | Admitting: Family

## 2025-06-16 ENCOUNTER — Ambulatory Visit: Admitting: Obstetrics and Gynecology
